# Patient Record
Sex: Male | Born: 1960 | Race: White | Hispanic: No | Marital: Married | Smoking: Former smoker
Health system: Southern US, Community
[De-identification: ages and names within clinical notes are randomized; demographics above are authoritative.]

## PROBLEM LIST (undated history)

## (undated) DIAGNOSIS — R7303 Prediabetes: Secondary | ICD-10-CM

## (undated) DIAGNOSIS — I2510 Atherosclerotic heart disease of native coronary artery without angina pectoris: Secondary | ICD-10-CM

## (undated) DIAGNOSIS — I4510 Unspecified right bundle-branch block: Secondary | ICD-10-CM

---
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
## 1979-05-13 HISTORY — PX: THORACENTESIS: SHX235

## 2007-04-02 ENCOUNTER — Ambulatory Visit: Payer: Self-pay | Admitting: Gastroenterology

## 2007-04-02 LAB — HM COLONOSCOPY

## 2008-02-13 DIAGNOSIS — J309 Allergic rhinitis, unspecified: Secondary | ICD-10-CM | POA: Insufficient documentation

## 2008-02-13 DIAGNOSIS — M545 Low back pain, unspecified: Secondary | ICD-10-CM | POA: Insufficient documentation

## 2008-07-12 DIAGNOSIS — I319 Disease of pericardium, unspecified: Secondary | ICD-10-CM

## 2008-07-12 HISTORY — DX: Disease of pericardium, unspecified: I31.9

## 2008-07-16 ENCOUNTER — Emergency Department: Payer: Self-pay | Admitting: Emergency Medicine

## 2008-08-12 DIAGNOSIS — G4730 Sleep apnea, unspecified: Secondary | ICD-10-CM | POA: Insufficient documentation

## 2008-08-12 DIAGNOSIS — I319 Disease of pericardium, unspecified: Secondary | ICD-10-CM | POA: Insufficient documentation

## 2008-08-12 DIAGNOSIS — I200 Unstable angina: Secondary | ICD-10-CM | POA: Insufficient documentation

## 2008-08-12 DIAGNOSIS — G4733 Obstructive sleep apnea (adult) (pediatric): Secondary | ICD-10-CM | POA: Insufficient documentation

## 2009-07-04 ENCOUNTER — Emergency Department: Payer: Self-pay | Admitting: Emergency Medicine

## 2009-08-16 ENCOUNTER — Ambulatory Visit: Payer: Self-pay | Admitting: General Practice

## 2009-09-27 ENCOUNTER — Ambulatory Visit: Payer: Self-pay | Admitting: General Practice

## 2009-10-25 ENCOUNTER — Ambulatory Visit: Payer: Self-pay | Admitting: Family Medicine

## 2009-10-27 ENCOUNTER — Emergency Department: Payer: Self-pay | Admitting: Emergency Medicine

## 2010-01-03 ENCOUNTER — Ambulatory Visit: Payer: Self-pay | Admitting: General Practice

## 2011-06-11 IMAGING — CR DG CHEST 2V
1 series · 3 of 3 positions shown · non-contrast
Comparison: none

REASON FOR EXAM: chest pain
COMMENTS:

[Series 1: view not recorded · 0.17mm/px · 3 of 3 slices shown]
[im 1/3]
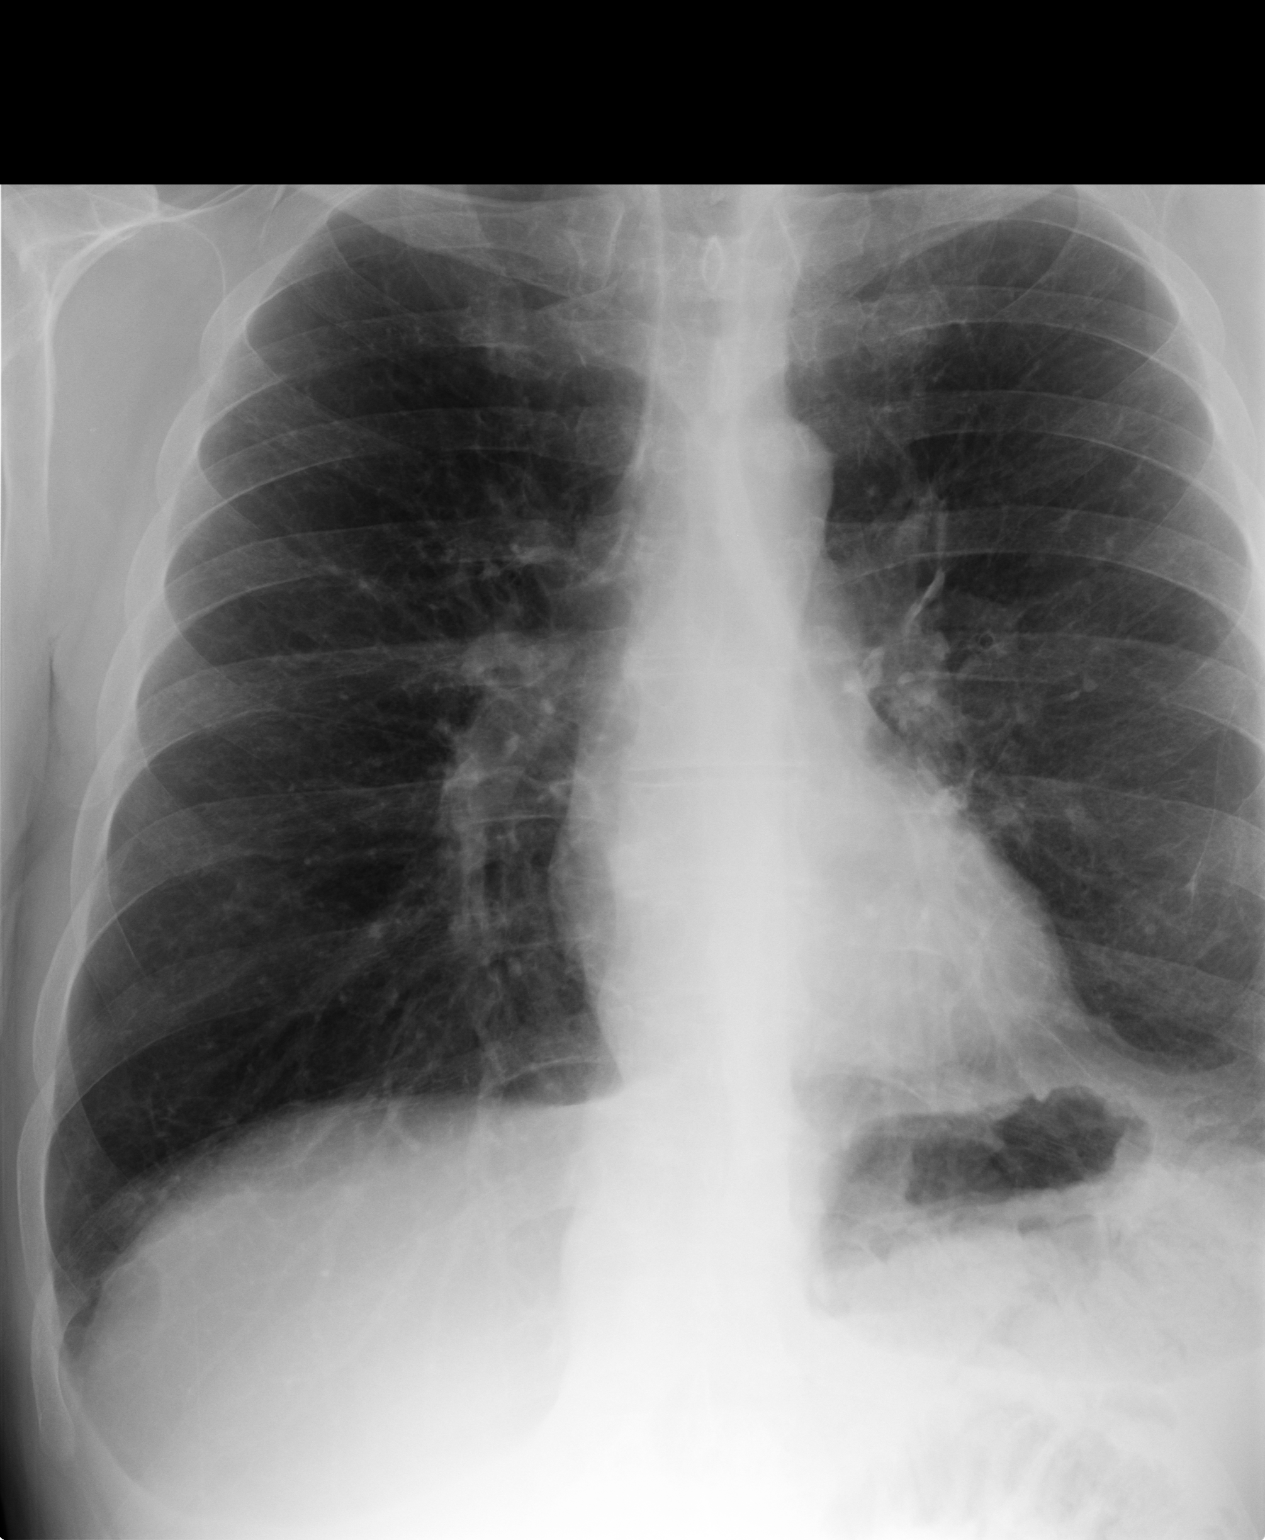
[im 2/3]
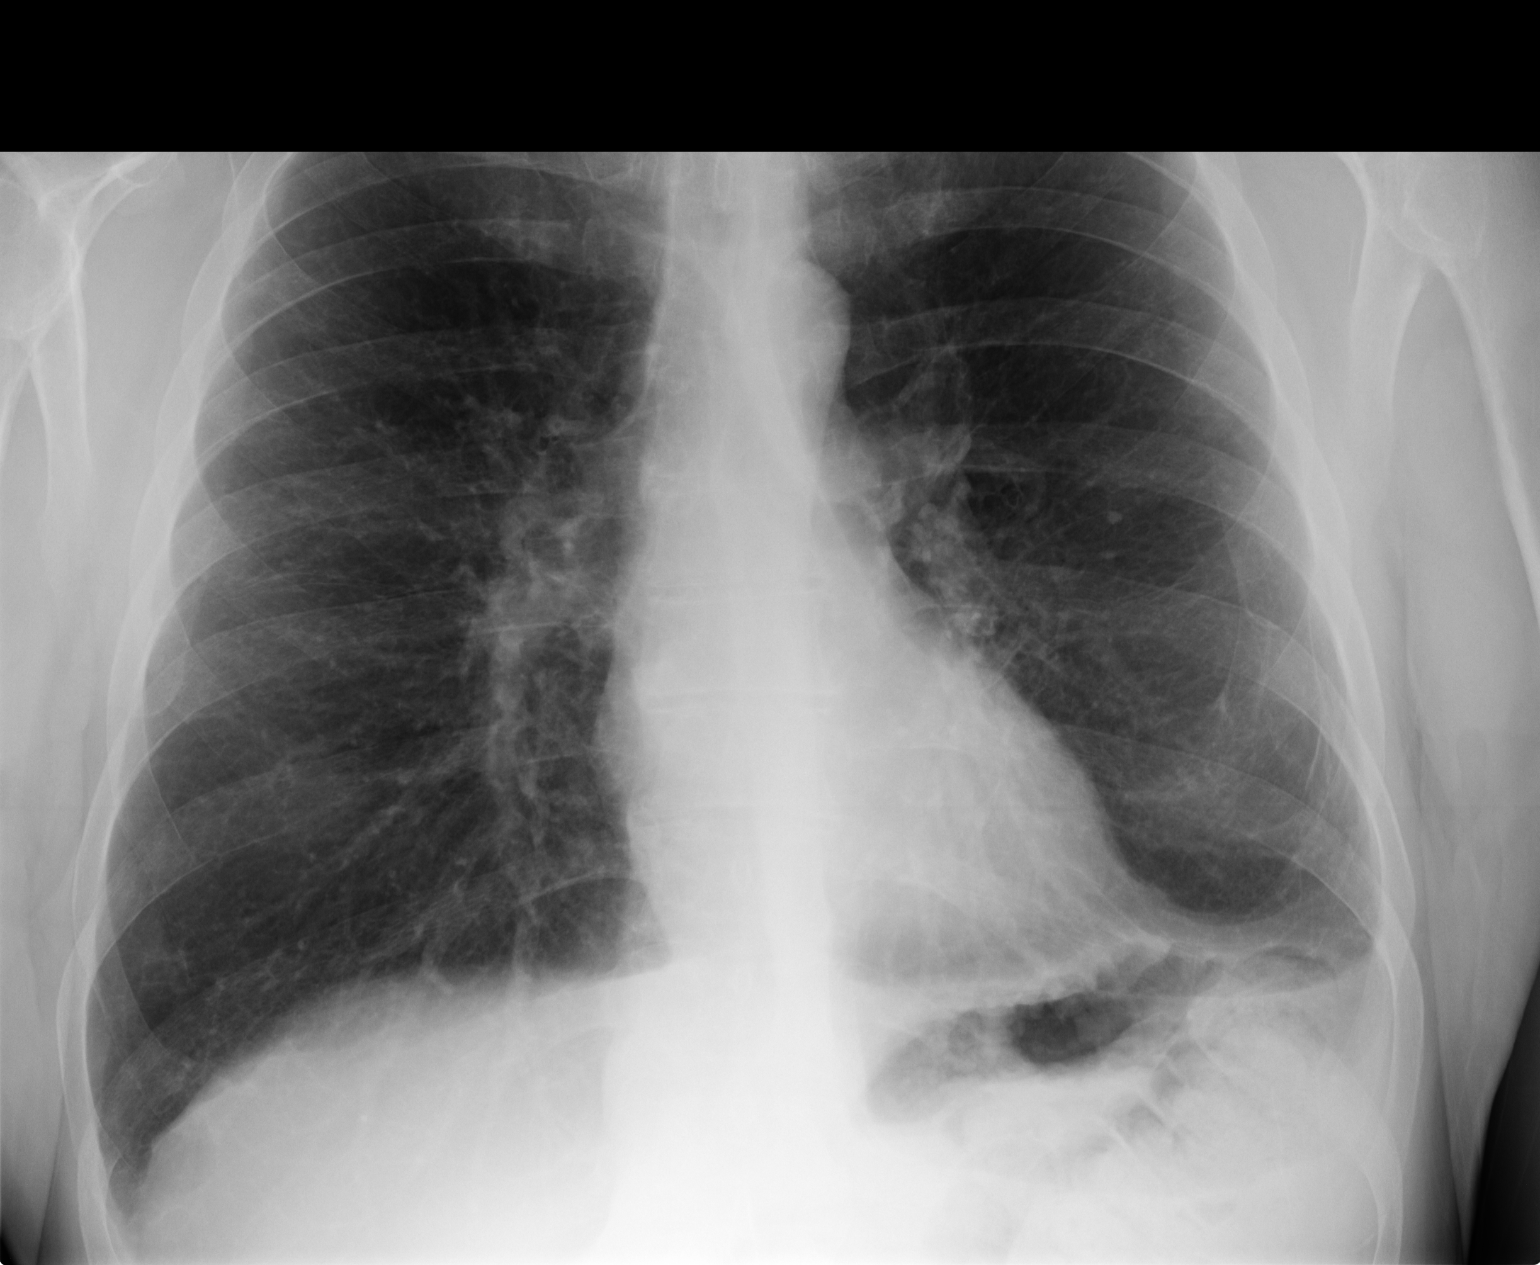
[im 3/3]
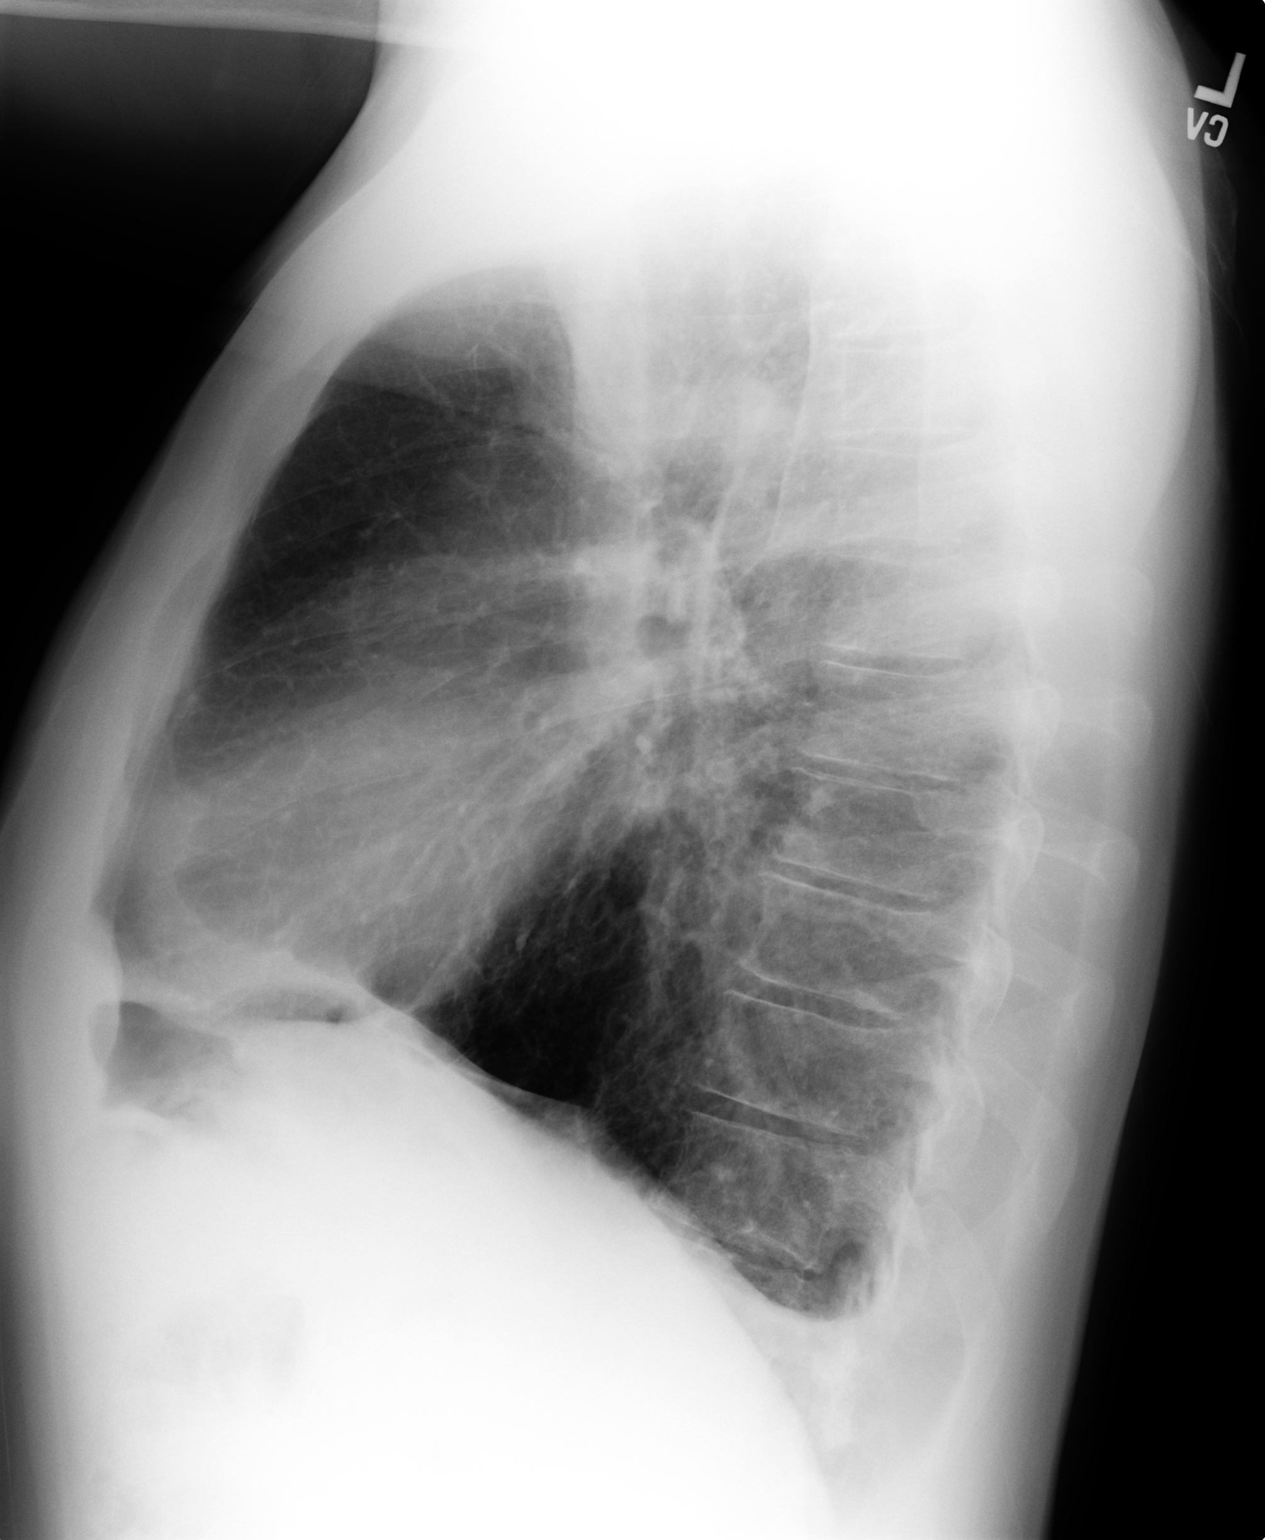

[3 of 3 positions shown; findings below may reference images not displayed]

PROCEDURE:     KDR - KDXR CHEST PA (OR AP) AND LAT  - October 25, 2009 [DATE]

RESULT:     There is increased density in the left base with blunting of the
left costophrenic angle. The findings are consistent with either fibrosis or
a small effusion. The lung fields otherwise are clear. The chest is
hyperexpanded compatible with COPD. Heart size is normal.
IMPRESSION: 1. There is increased density at the left base compatible with a small
effusion or with pleural fibrosis.
2. COPD.

## 2011-06-11 IMAGING — CR DG SHOULDER 3+V*L*
1 series · 3 of 3 positions shown · non-contrast
Comparison: none

REASON FOR EXAM: pain
COMMENTS:

PROCEDURE:     KDR - KDXR SHOULDER LEFT COMPLETE  - October 25, 2009 [DATE]
RESULT:     No fracture, dislocation or other acute bony abnormality is
seen. No soft tissue calcification about the humeral head is observed. No
fracture of the clavicle is seen.

[Series 2: view not recorded · 0.17mm/px · 3 of 3 slices shown]
[im 1/3]
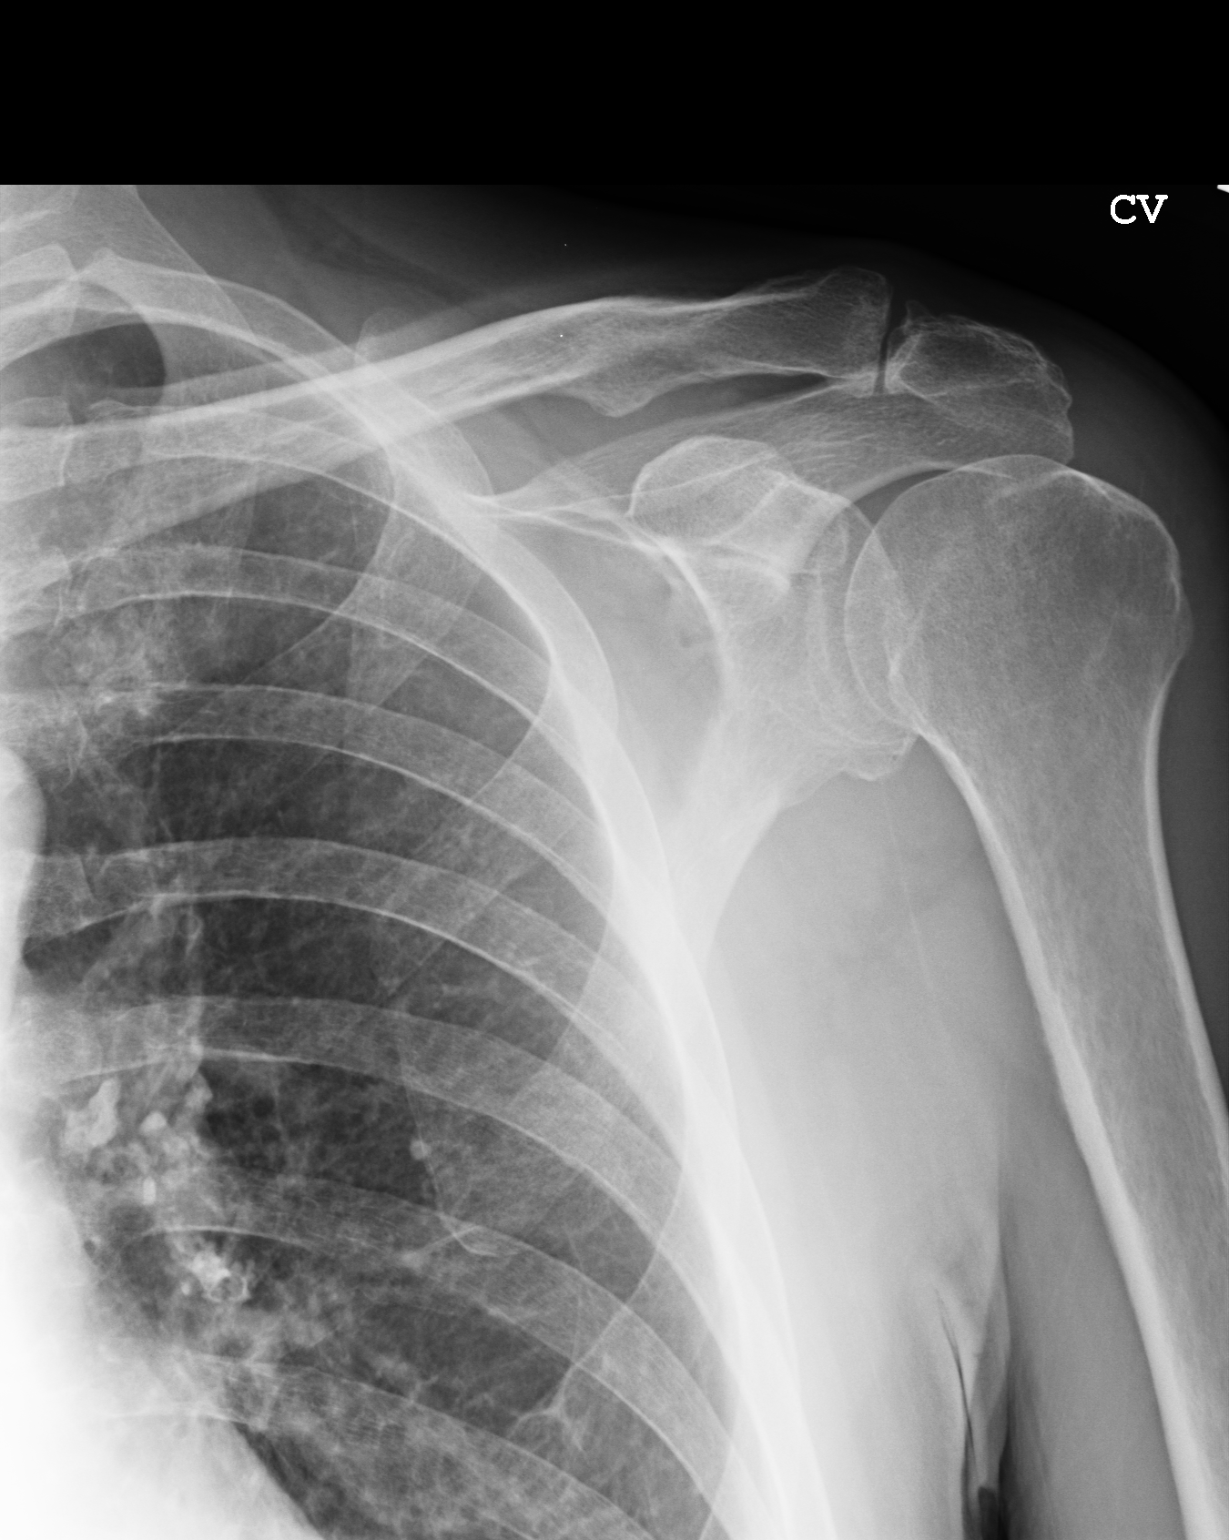
[im 2/3]
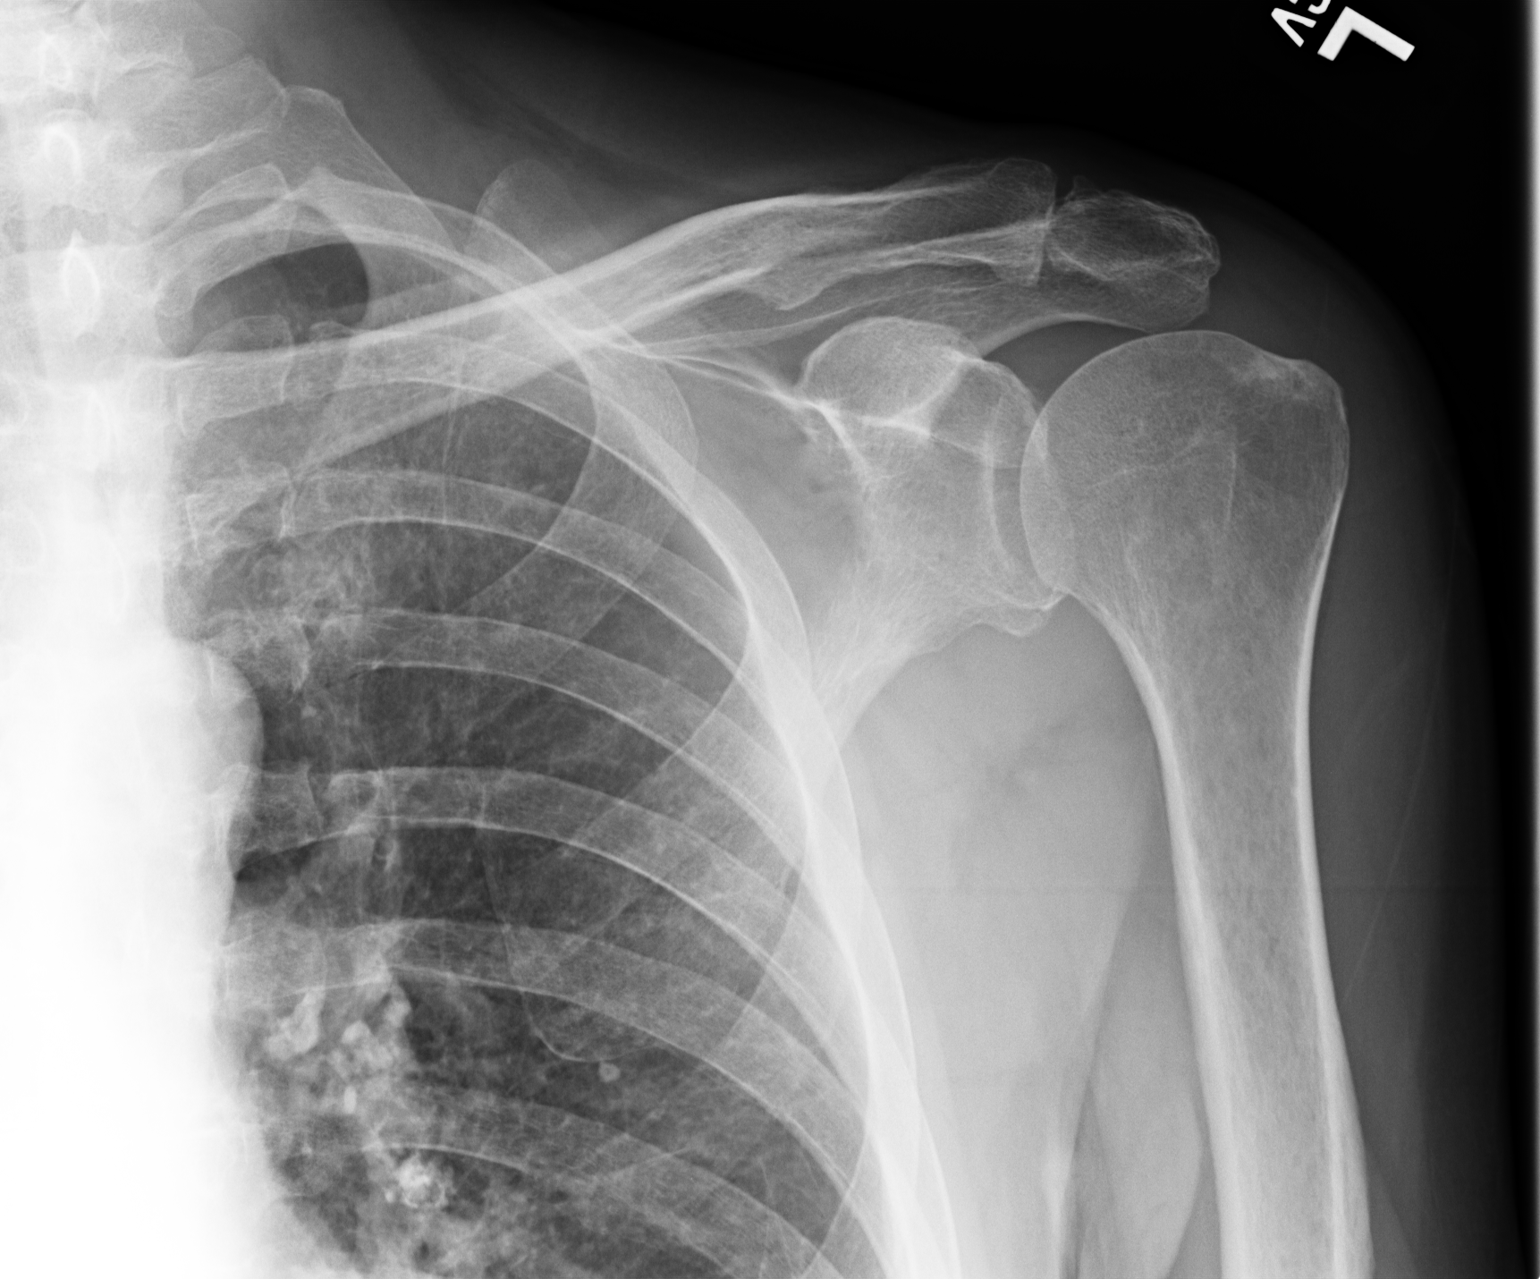
[im 3/3]
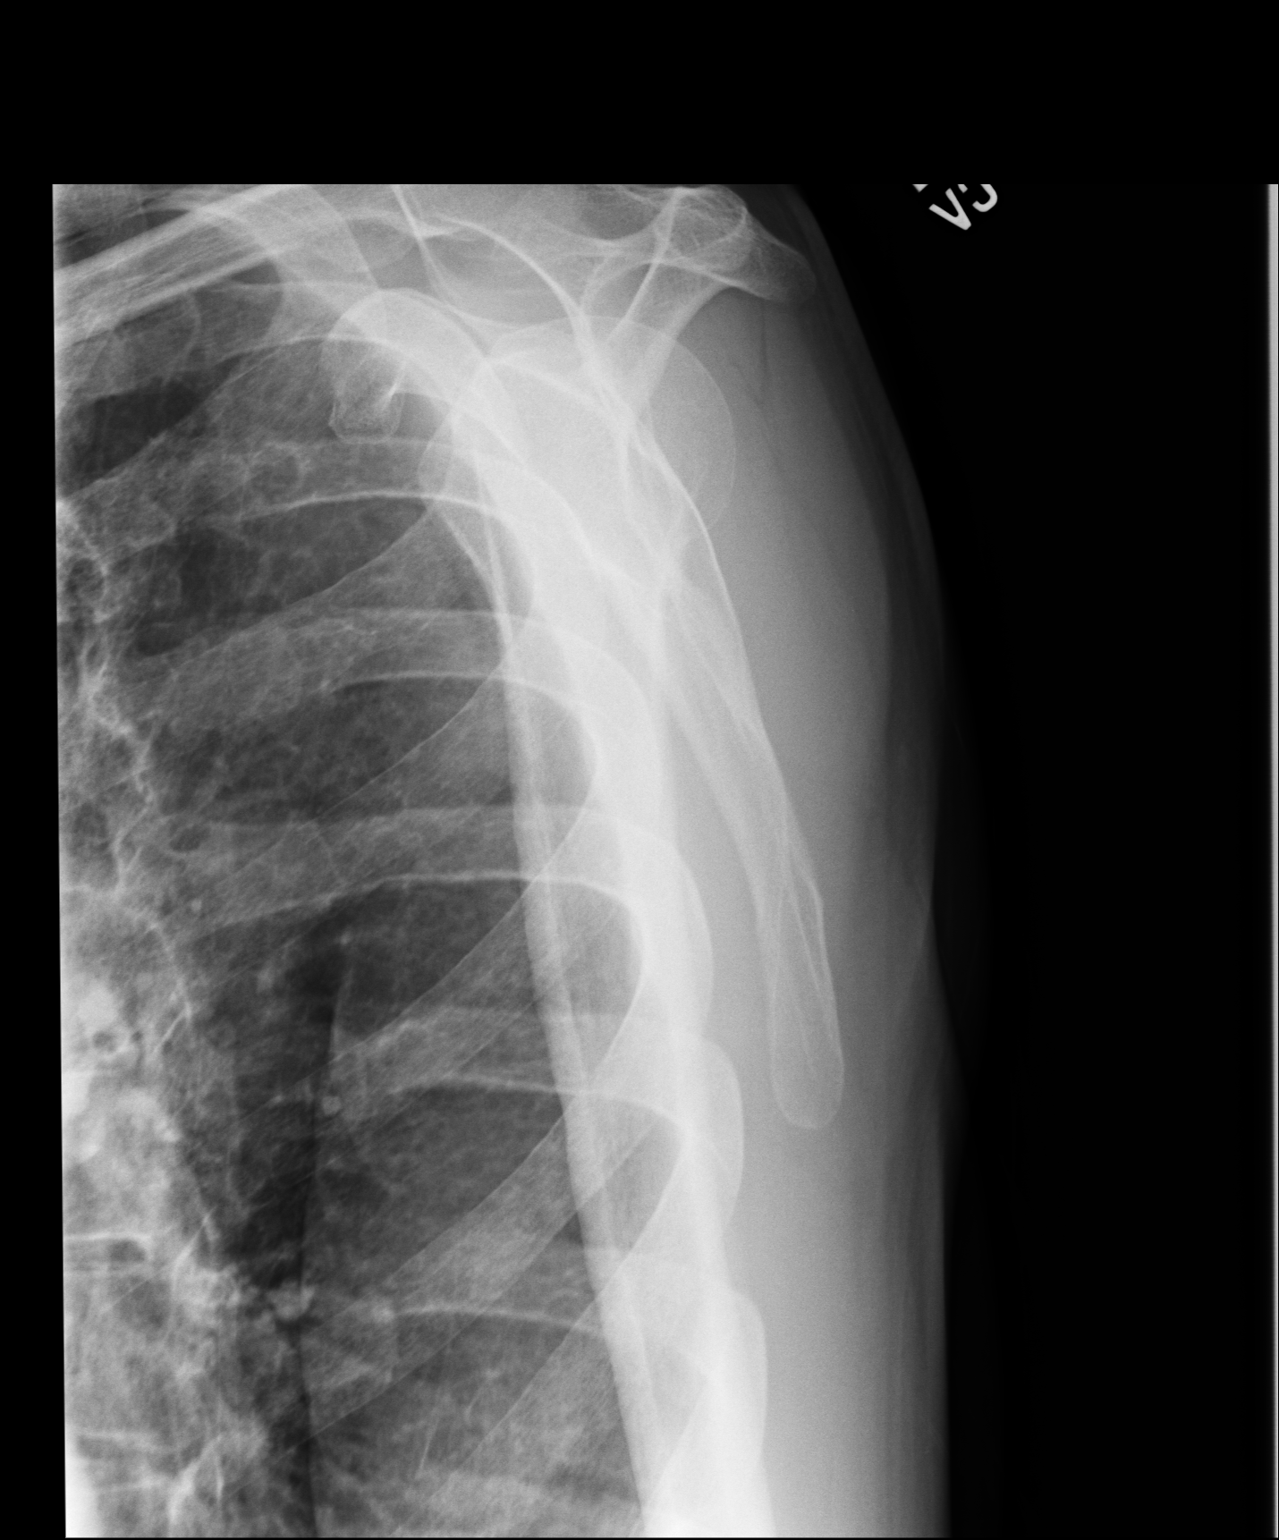

[3 of 3 positions shown; findings below may reference images not displayed]

IMPRESSION: 1.     No acute bony abnormalities are identified.

## 2012-03-25 ENCOUNTER — Ambulatory Visit: Payer: Self-pay | Admitting: Urology

## 2012-05-27 ENCOUNTER — Ambulatory Visit: Payer: Self-pay | Admitting: Surgery

## 2012-05-27 LAB — CBC WITH DIFFERENTIAL/PLATELET
Basophil #: 0 10*3/uL (ref 0.0–0.1)
Basophil %: 0.7 %
Eosinophil #: 0.1 10*3/uL (ref 0.0–0.7)
Eosinophil %: 1.5 %
HCT: 42.3 % (ref 40.0–52.0)
HGB: 14.8 g/dL (ref 13.0–18.0)
Lymphocyte #: 1.1 10*3/uL (ref 1.0–3.6)
Lymphocyte %: 24.5 %
MCH: 33.1 pg (ref 26.0–34.0)
MCHC: 34.9 g/dL (ref 32.0–36.0)
MCV: 95 fL (ref 80–100)
Monocyte #: 0.5 x10 3/mm (ref 0.2–1.0)
Monocyte %: 11.8 %
Neutrophil #: 2.8 10*3/uL (ref 1.4–6.5)
Neutrophil %: 61.5 %
Platelet: 159 10*3/uL (ref 150–440)
RBC: 4.46 10*6/uL (ref 4.40–5.90)
RDW: 13 % (ref 11.5–14.5)
WBC: 4.6 10*3/uL (ref 3.8–10.6)

## 2012-05-27 LAB — BASIC METABOLIC PANEL
Anion Gap: 7 (ref 7–16)
BUN: 14 mg/dL (ref 7–18)
Calcium, Total: 9.2 mg/dL (ref 8.5–10.1)
Chloride: 110 mmol/L — ABNORMAL HIGH (ref 98–107)
Co2: 25 mmol/L (ref 21–32)
Creatinine: 0.73 mg/dL (ref 0.60–1.30)
EGFR (African American): >60
EGFR (Non-African Amer.): >60
Glucose: 85 mg/dL (ref 65–99)
Osmolality: 283 (ref 275–301)
Potassium: 3.8 mmol/L (ref 3.5–5.1)
Sodium: 142 mmol/L (ref 136–145)

## 2012-06-03 ENCOUNTER — Ambulatory Visit: Payer: Self-pay | Admitting: Surgery

## 2013-11-09 IMAGING — US US PELVIS LIMITED
1 series · 14 of 25 positions shown · non-contrast
Comparison: none

REASON FOR EXAM: left scrotal mass
COMMENTS:

PROCEDURE:     NISHIMURA - NISHIMURA TESTICULAR  - March 25, 2012  [DATE]
RESULT:

[Series 1: us pelvis limited · 0.08mm/px · 14 of 76 slices shown]
[im 1/76]
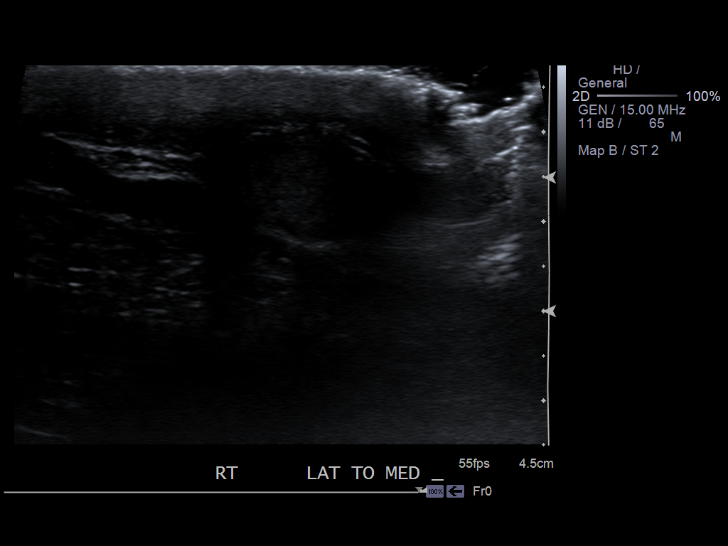
[im 7/76]
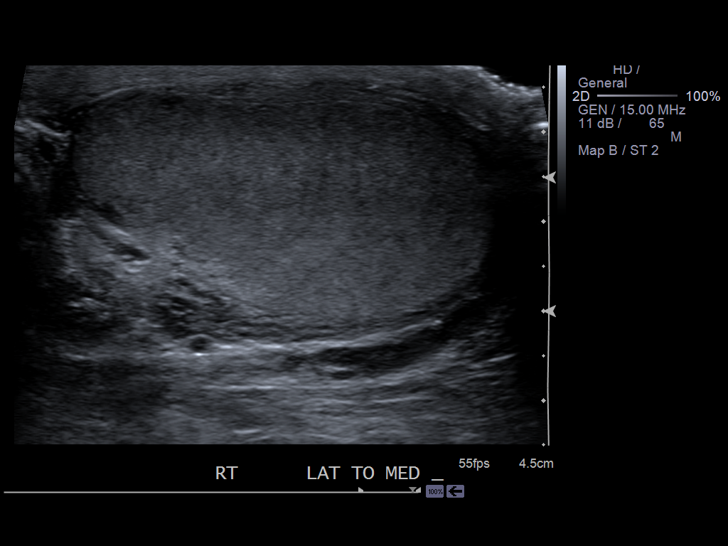
[im 13/76]
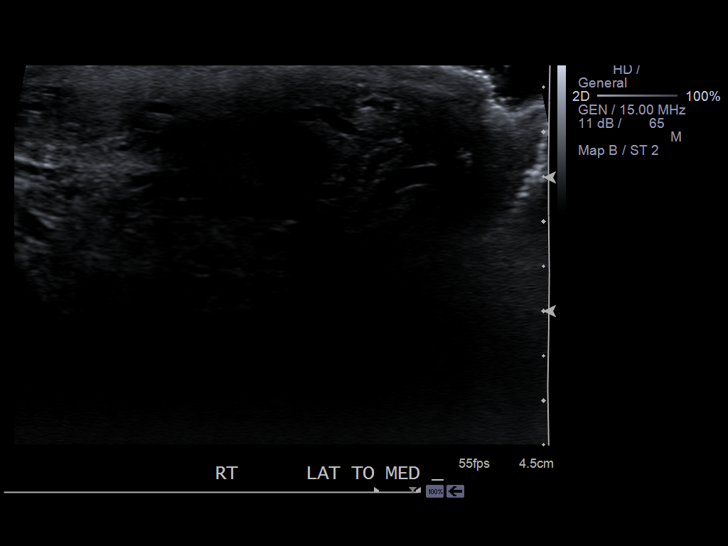
[im 19/76]
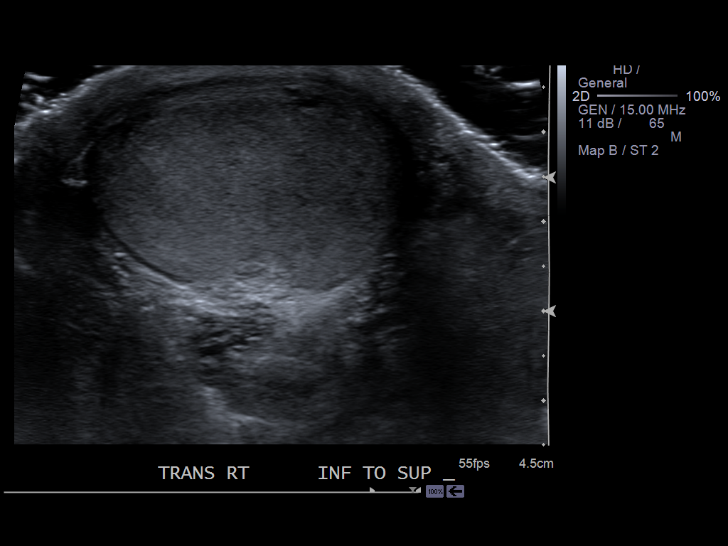
[im 26/76]
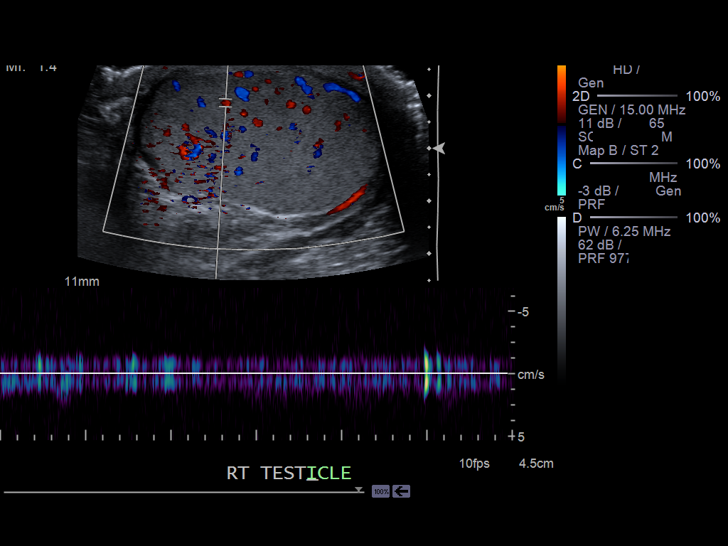
[im 29/76]
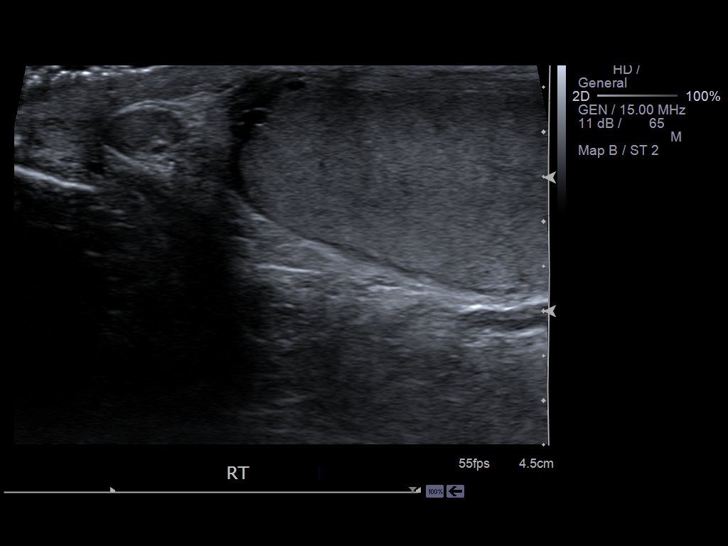
[im 35/76]
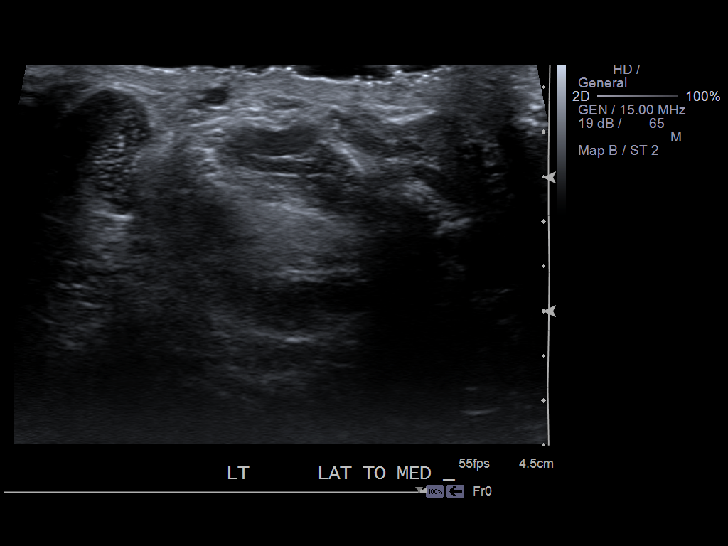
[im 41/76]
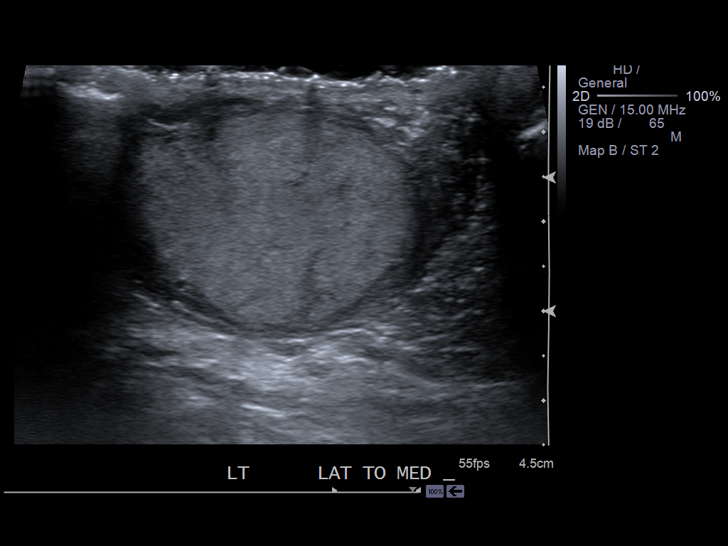
[im 47/76]
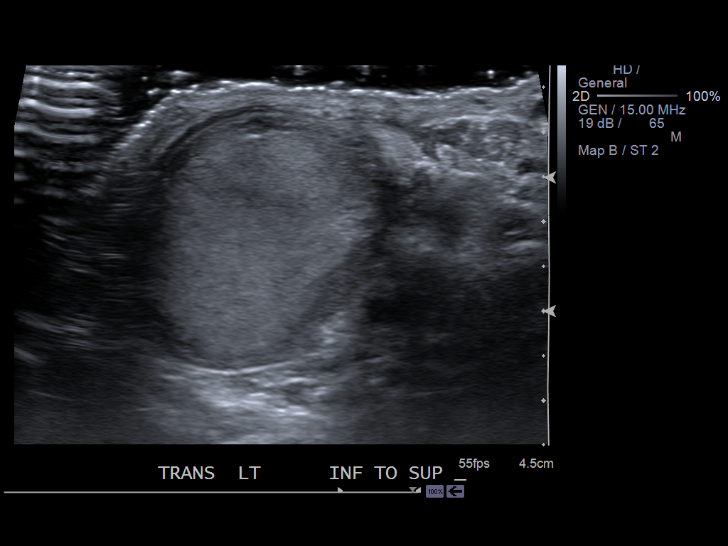
[im 51/76]
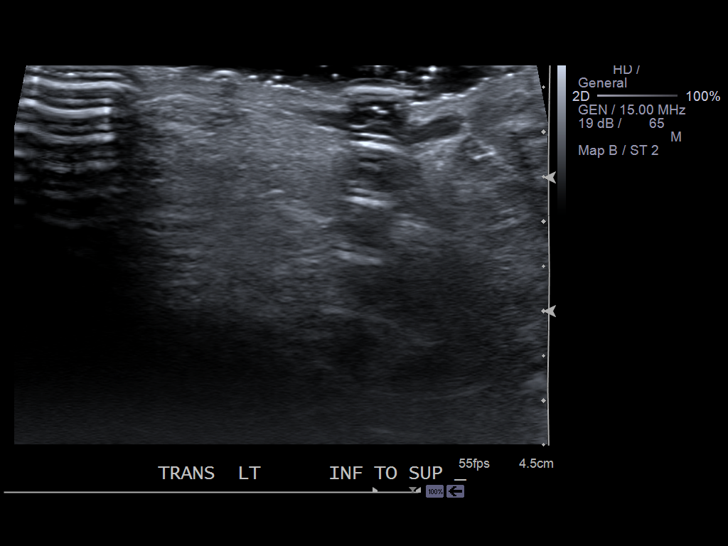
[im 57/76]
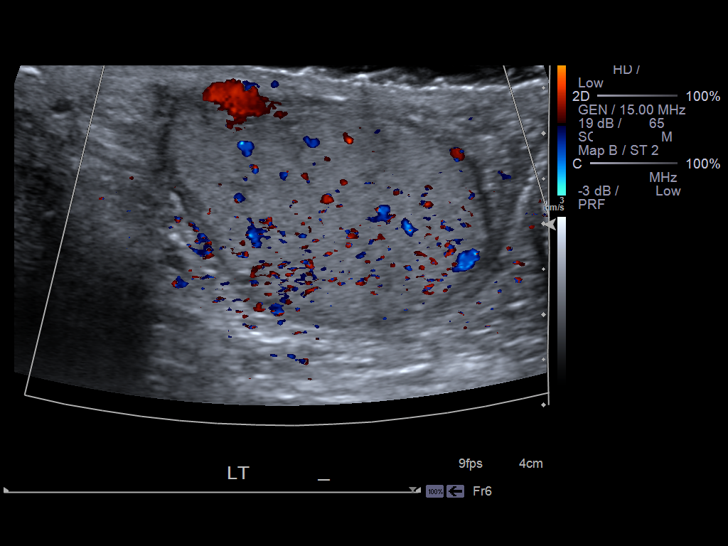
[im 63/76]
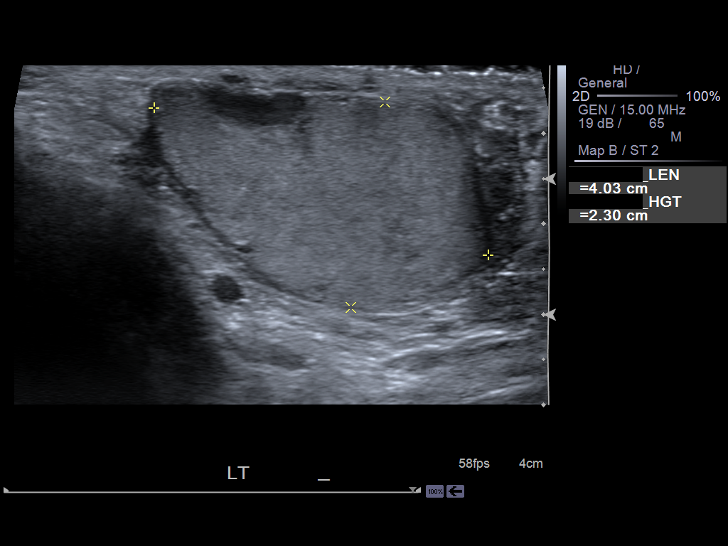
[im 69/76]
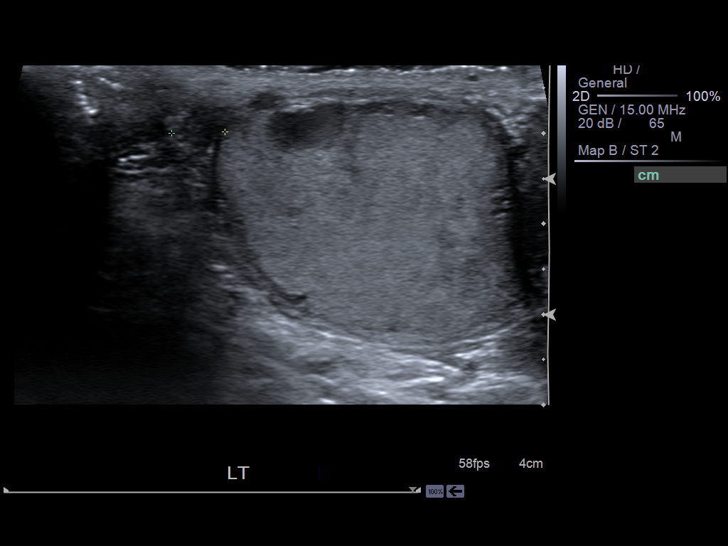
[im 76/76]
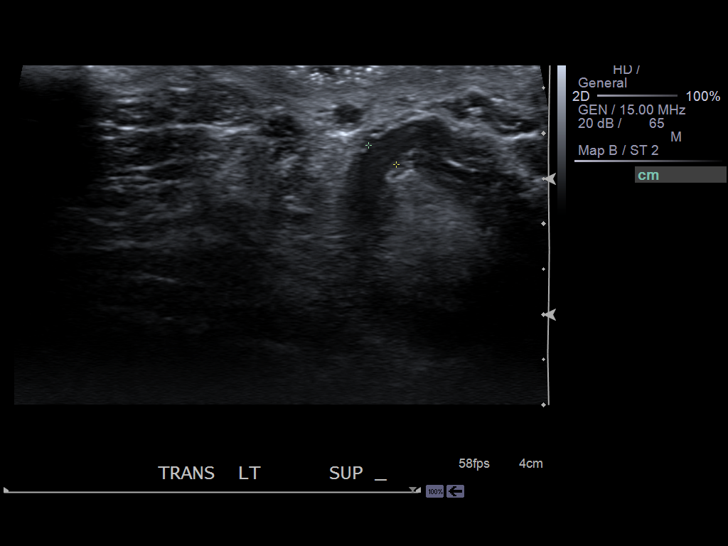

[14 of 25 positions shown; findings below may reference images not displayed]

FINDINGS: The right testicle measures 4.58 x 2.61 x 3.38 cm and the left
4.03 x 2.3 x 3.1 cm. The testicles demonstrate homogeneous echotexture.
Color filling of the testicular vessels is identified. Arterial and venous
waveforms are appreciated. The right epididymis measures 0.96 cm and the
left 0.59 cm. The epididymides demonstrate a homogeneous echotexture. A left
varicocele is appreciated. The varicocele appears to be adjacent to the left
testicle or possibly even traversing the superior aspect of the testicle.
There is no evidence of a varicocele on the right nor evidence of hydrocele
on the right or left.
IMPRESSION: 1. Left-sided varicocele which either rest adjacent to the left testicle or
possibly traverses the superior aspect of the testicle.
2. No further sonographic abnormality is identified.

## 2014-09-30 LAB — HEPATIC FUNCTION PANEL
ALT: 17 U/L (ref 10–40)
AST: 19 U/L (ref 14–40)

## 2014-09-30 LAB — BASIC METABOLIC PANEL
BUN: 12 mg/dL (ref 4–21)
Creatinine: 0.8 mg/dL (ref 0.6–1.3)
Glucose: 91 mg/dL
Potassium: 4.7 mmol/L (ref 3.4–5.3)
Sodium: 140 mmol/L (ref 137–147)

## 2014-09-30 LAB — LIPID PANEL
Cholesterol: 187 mg/dL (ref 0–200)
HDL: 36 mg/dL (ref 35–70)
LDL Cholesterol: 115 mg/dL
Triglycerides: 178 mg/dL — AB (ref 40–160)

## 2014-09-30 LAB — CBC AND DIFFERENTIAL
HCT: 45 % (ref 41–53)
Hemoglobin: 15.9 g/dL (ref 13.5–17.5)
Platelets: 268 10*3/uL (ref 150–399)
WBC: 5.9 10^3/mL

## 2014-09-30 LAB — TSH: TSH: 1.98 u[IU]/mL (ref 0.41–5.90)

## 2014-09-30 LAB — PSA: PSA: 0.8

## 2015-06-18 DIAGNOSIS — M952 Other acquired deformity of head: Secondary | ICD-10-CM | POA: Insufficient documentation

## 2015-06-18 DIAGNOSIS — Z6836 Body mass index (BMI) 36.0-36.9, adult: Secondary | ICD-10-CM | POA: Insufficient documentation

## 2015-06-18 DIAGNOSIS — N5089 Other specified disorders of the male genital organs: Secondary | ICD-10-CM | POA: Insufficient documentation

## 2015-06-18 DIAGNOSIS — Z789 Other specified health status: Secondary | ICD-10-CM | POA: Insufficient documentation

## 2015-06-18 DIAGNOSIS — E669 Obesity, unspecified: Secondary | ICD-10-CM | POA: Insufficient documentation

## 2015-06-21 ENCOUNTER — Ambulatory Visit (INDEPENDENT_AMBULATORY_CARE_PROVIDER_SITE_OTHER): Payer: Managed Care, Other (non HMO) | Admitting: Family Medicine

## 2015-06-21 ENCOUNTER — Encounter: Payer: Self-pay | Admitting: Family Medicine

## 2015-06-21 VITALS — BP 108/72 | HR 80 | Temp 98.2°F | Resp 20 | Ht 72.0 in | Wt 267.6 lb

## 2015-06-21 DIAGNOSIS — I2510 Atherosclerotic heart disease of native coronary artery without angina pectoris: Secondary | ICD-10-CM | POA: Diagnosis not present

## 2015-06-21 DIAGNOSIS — E668 Other obesity: Secondary | ICD-10-CM

## 2015-06-21 DIAGNOSIS — E669 Obesity, unspecified: Secondary | ICD-10-CM | POA: Diagnosis not present

## 2015-06-21 DIAGNOSIS — IMO0002 Reserved for concepts with insufficient information to code with codable children: Secondary | ICD-10-CM

## 2015-06-21 NOTE — Progress Notes (Signed)
Subjective:    Patient ID: Caleb Lopez, male    DOB: 1961-02-28, 55 y.o.   MRN: [id number]  HPI  Obesity: Patient complains of obesity. Patient cites health as reasons for wanting to lose weight.  Obesity History Weight in late teens: 185lb lb. Period of greatest weight gain: 100+ lb during mid adult years Lowest adult weight: 180lb Highest adult weight: 320lb Amount of time at present weight: 5 months.   History of Weight Loss Efforts Greatest amount of weight lost: 140 lb over 6 months Amount of time that loss was maintained: 4 years months Circumstances associated with regain of weight: not being able to exercise due to fasciitis. Successful weight loss techniques attempted: self-directed dieting . Is  A vegetarian.   Unsuccessful weight loss techniques attempted: pt reports it is easy for him to lose weight when he can exercise.  Current Exercise Habits none secondary to fasciitis and other foot pain  Current Eating Habits Number of regular meals per day: 2 Number of snacking episodes per day: 0 Who shops for food? wife Who prepares food? patient Who eats with patient? patient Binge behavior?: yes - can eat up to 1 whole large pizza (once every 2 months or so) Purge behavior? no  Anorexic behavior? no Eating precipitated by stress? no Guilt feelings associated with eating? no  Does eat a lot of beans.   Does not eat red meat.   Does eat a lot of burrito shells and crackers.  Does eat a lot of beans and  Nuts.   Has a plan.   Is going to stop eating burrito shells and stick with his beans.  Is planning to eat some eggs. Does not fry up eggs because eats to many.    Review of Systems  Constitutional: Negative for fever, chills, diaphoresis, activity change, appetite change, fatigue and unexpected weight change.   BP 108/72 mmHg  Pulse 80  Temp(Src) 98.2 F (36.8 C) (Oral)  Resp 20  Ht 6' (1.829 m)  Wt 267 lb 9.6 oz (121.383 kg)  BMI 36.29 kg/m2   Patient  Active Problem List   Diagnosis Date Noted  . Adult BMI 30+ 06/18/2015  . Adiposity 06/18/2015  . Lump in scrotum 06/18/2015  . Acquired deformity of head 06/18/2015  . Strict vegetarian diet 06/18/2015  . 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 Pericarditis 08/12/2008  . Apnea, sleep 08/12/2008  . Allergic rhinitis 02/13/2008  . LBP (low back pain) 02/13/2008   No past medical history on file. No current outpatient prescriptions on file prior to visit.   No current facility-administered medications on file prior to visit.   No Known Allergies Past Surgical History  Procedure Laterality Date  . Tonsillectomy and adenoidectomy  1960'S  . Thoracentesis  1980'S    x's 2 probably infection   Social History   Social History  . Marital Status: Married    Spouse Name: N/A  . Number of Children: N/A  . Years of Education: N/A   Occupational History  . Not on file.   Social History Main Topics  . Smoking status: Former Smoker    Quit date: 09/11/1988  . Smokeless tobacco: Never Used  . Alcohol Use: Yes     Comment: occasional  . Drug Use: No  . Sexual Activity: Not on file   Other Topics Concern  . Not on file   Social History Narrative   Family History  Problem Relation Age of Onset  . Diverticulitis  Mother   . Arthritis Mother   . Glaucoma Mother   . Diabetes Father       Objective:   Physical Exam  Constitutional: He is oriented to person, place, and time. He appears well-developed and well-nourished.  Neurological: He is alert and oriented to person, place, and time.  Psychiatric: He has a normal mood and affect. His behavior is normal. Judgment and thought content normal.   BP 108/72 mmHg  Pulse 80  Temp(Src) 98.2 F (36.8 C) (Oral)  Resp 20  Ht 6' (1.829 m)  Wt 267 lb 9.6 oz (121.383 kg)  BMI 36.29 kg/m2     Assessment & Plan:  1. Adult BMI 30+ Improved. Has gained some weight back because he is not exercising.  Will restart exercise.     2. Obesity Recurrent.  Is still down 50 pounds. Will add back some eggs. Continue to decrease carbohydrates and monitor diet. Recommend look into low carb foods.    3. Coronary artery disease involving native coronary artery of native heart without angina pectoris  Continue healthy lifestyle and exercise. Add some protein and filled out form for work.  Over 15 minutes in direct counseling regarding diet and exercise.      Margarita Rana, MD

## 2016-01-17 ENCOUNTER — Encounter: Payer: Self-pay | Admitting: Podiatry

## 2016-01-17 ENCOUNTER — Ambulatory Visit (INDEPENDENT_AMBULATORY_CARE_PROVIDER_SITE_OTHER): Payer: Managed Care, Other (non HMO) | Admitting: Podiatry

## 2016-01-17 ENCOUNTER — Ambulatory Visit (INDEPENDENT_AMBULATORY_CARE_PROVIDER_SITE_OTHER): Payer: Managed Care, Other (non HMO)

## 2016-01-17 VITALS — BP 131/85 | HR 74 | Resp 16

## 2016-01-17 DIAGNOSIS — M898X9 Other specified disorders of bone, unspecified site: Secondary | ICD-10-CM | POA: Diagnosis not present

## 2016-01-17 DIAGNOSIS — M2041 Other hammer toe(s) (acquired), right foot: Secondary | ICD-10-CM

## 2016-01-17 DIAGNOSIS — Q828 Other specified congenital malformations of skin: Secondary | ICD-10-CM

## 2016-01-17 NOTE — Progress Notes (Signed)
   Subjective:    Patient ID: Caleb Lopez, male    DOB: [birth year], 55 y.o.   MRN: [id number]  HPI: He presents today with a chief complaint of warts between fourth and fifth digits of the right foot. He states that he's been doing his dermatologist several times and has been frozen off with liquid nitrogen. He states they continue to come back. He states that they're painful and very aggravating at times.    Review of Systems  All other systems reviewed and are negative.      Objective:   Physical Exam: Vital signs are stable alert and oriented 3. Pulses are strongly palpable. Neurologic sensorium is intact. Degenerative flexors are intact. Muscle strength is normal. Deep tendon reflexes are intact bilateral muscle strength is normal bilateral. 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 Radiographs do demonstrate neck size ptosis distal phalanx fifth digit right foot. Reactive hyperkeratosis is also noted to the lateral aspect of the PIPJ fifth digit right foot.        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 Discussed possible need for surgery in the future. He understands this is amenable to it.

## 2016-06-19 ENCOUNTER — Encounter: Payer: Self-pay | Admitting: Family Medicine

## 2016-06-19 ENCOUNTER — Ambulatory Visit (INDEPENDENT_AMBULATORY_CARE_PROVIDER_SITE_OTHER): Payer: Managed Care, Other (non HMO) | Admitting: Family Medicine

## 2016-06-19 VITALS — BP 108/76 | HR 63 | Temp 97.7°F | Resp 14 | Ht 71.5 in | Wt 264.4 lb

## 2016-06-19 DIAGNOSIS — Z1159 Encounter for screening for other viral diseases: Secondary | ICD-10-CM | POA: Diagnosis not present

## 2016-06-19 DIAGNOSIS — Z6836 Body mass index (BMI) 36.0-36.9, adult: Secondary | ICD-10-CM | POA: Diagnosis not present

## 2016-06-19 DIAGNOSIS — Z125 Encounter for screening for malignant neoplasm of prostate: Secondary | ICD-10-CM | POA: Diagnosis not present

## 2016-06-19 DIAGNOSIS — Z114 Encounter for screening for human immunodeficiency virus [HIV]: Secondary | ICD-10-CM

## 2016-06-19 DIAGNOSIS — Z0000 Encounter for general adult medical examination without abnormal findings: Secondary | ICD-10-CM | POA: Diagnosis not present

## 2016-06-19 DIAGNOSIS — Z1211 Encounter for screening for malignant neoplasm of colon: Secondary | ICD-10-CM

## 2016-06-19 DIAGNOSIS — I2510 Atherosclerotic heart disease of native coronary artery without angina pectoris: Secondary | ICD-10-CM

## 2016-06-19 LAB — POCT URINALYSIS DIPSTICK
Bilirubin, UA: NEGATIVE
Blood, UA: NEGATIVE
Glucose, UA: NEGATIVE
Ketones, UA: NEGATIVE
Leukocytes, UA: NEGATIVE
Nitrite, UA: NEGATIVE
Protein, UA: NEGATIVE
Spec Grav, UA: 1.025
Urobilinogen, UA: 0.2
pH, UA: 5

## 2016-06-19 LAB — IFOBT (OCCULT BLOOD): IFOBT: NEGATIVE

## 2016-06-19 LAB — FECAL OCCULT BLOOD, GUAIAC: Fecal Occult Blood: NEGATIVE

## 2016-06-19 NOTE — Progress Notes (Signed)
Patient: Caleb Lopez, Male    DOB: Oct 31, 1960, 55 y.o.   MRN: [id number] Visit Date: 06/19/2016  Today's Provider: Vernie Murders, PA   Chief Complaint  Patient presents with  . Annual Exam   Subjective:    Annual physical exam Caleb Lopez is a 55 y.o. male who presents today for health maintenance and complete physical. He feels well. He reports exercising 2 times per week. He reports he is sleeping average 6-7 hours per night.  -----------------------------------------------------------------   Review of Systems  Constitutional: Negative.   HENT: Negative.   Eyes: Negative.   Respiratory: Negative.   Cardiovascular: Negative.   Gastrointestinal: Negative.   Endocrine: Negative.   Genitourinary: Negative.   Musculoskeletal: Negative.   Skin: Negative.   Allergic/Immunologic: Negative.   Neurological: Negative.   Hematological: Negative.   Psychiatric/Behavioral: Negative.     Social History      He  reports that he quit smoking about 27 years ago. He has never used smokeless tobacco. He reports that he drinks alcohol. He reports that he does not use drugs.       Social History   Social History  . Marital status: Married    Spouse name: N/A  . Number of children: N/A  . Years of education: N/A   Social History Main Topics  . Smoking status: Former Smoker    Quit date: 09/11/1988  . Smokeless tobacco: Never Used  . Alcohol use Yes     Comment: occasional  . Drug use: No  . Sexual activity: Not Asked   Other Topics Concern  . None   Social History Narrative  . None    Patient Active Problem List   Diagnosis Date Noted  . Obesity 06/21/2015  . Coronary artery disease 06/21/2015  . Adult BMI 30+ 06/18/2015  . Adiposity 06/18/2015  . Lump in scrotum 06/18/2015  . Acquired deformity of head 06/18/2015  . Strict vegetarian diet 06/18/2015  . Pericarditis 08/12/2008  . Apnea, sleep 08/12/2008  . Allergic rhinitis 02/13/2008  . LBP (low  back pain) 02/13/2008    Past Surgical History:  Procedure Laterality Date  . THORACENTESIS  1980'S   x's 2 probably infection  . TONSILLECTOMY AND ADENOIDECTOMY  1960'S    Family History        Family Status  Relation Status  . Mother Alive  . Father Alive  . Sister Alive   pituitary adenoma        His family history includes Arthritis in his mother; Diabetes in his father; Diverticulitis in his mother; Glaucoma in his mother.    No Known Allergies  No outpatient prescriptions have been marked as taking for the 06/19/16 encounter (Office Visit) with Margo Common, PA.    Patient Care Team: Margo Common, PA as PCP - General (Family Medicine)     Objective:   Vitals: BP 108/76 (BP Location: Right Arm, Patient Position: Sitting, Cuff Size: Large)   Pulse 63   Temp 97.7 F (36.5 C) (Oral)   Resp 14   Ht 5' 11.5" (1.816 m)   Wt 264 lb 6.4 oz (119.9 kg)   BMI 36.36 kg/m   Wt Readings from Last 3 Encounters:  06/19/16 264 lb 6.4 oz (119.9 kg)  06/21/15 267 lb 9.6 oz (121.4 kg)  09/30/14 265 lb (120.2 kg)    Physical Exam  Constitutional: He is oriented to person, place, and time. He appears well-developed and well-nourished.  HENT:  Head: Normocephalic and atraumatic.  Right Ear: External ear normal.  Left Ear: External ear normal.  Nose: Nose normal.  Mouth/Throat: Oropharynx is clear and moist.  Eyes: Conjunctivae and EOM are normal. Pupils are equal, round, and reactive to light. Right eye exhibits no discharge.  Neck: Normal range of motion. Neck supple. No tracheal deviation present. No thyromegaly present.  Cardiovascular: Normal rate, regular rhythm, normal heart sounds and intact distal pulses.   No murmur heard. Pulmonary/Chest: Effort normal and breath sounds normal. No respiratory distress. He has no wheezes. He has no rales. He exhibits no tenderness.  Abdominal: Soft. Bowel sounds are normal. He exhibits no distension and no mass. There is no  tenderness. There is no rebound and no guarding.  Genitourinary: Prostate normal and penis normal.  Musculoskeletal: Normal range of motion. He exhibits no edema or tenderness.  Lymphadenopathy:    He has no cervical adenopathy.  Neurological: He is alert and oriented to person, place, and time. He has normal reflexes. No cranial nerve deficit. He exhibits normal muscle tone. Coordination normal.  Skin: Skin is warm and dry. No rash noted. No erythema.  Psychiatric: He has a normal mood and affect. His behavior is normal. Judgment and thought content normal.   Depression Screen PHQ 2/9 Scores 06/19/2016  PHQ - 2 Score 0    Assessment & Plan:     Routine Health Maintenance and Physical Exam  Exercise Activities and Dietary recommendations Goals    Walking a mile daily and following a vegetarian-type diet.      Immunization History  Administered Date(s) Administered  . Tdap 04/29/2012  . Zoster 04/29/2012    Health Maintenance  Topic Date Due  . Hepatitis C Screening  09-29-1960  . HIV Screening  03/02/1974  . INFLUENZA VACCINE  04/11/2016  . COLONOSCOPY  04/01/2017  . TETANUS/TDAP  04/29/2022      Discussed health benefits of physical activity, and encouraged him to engage in regular exercise appropriate for his age and condition.    -------------------------------------------------------------------- 1. Annual physical exam General health stable. Immunizations up to date. EKG showed RBBB but denies chest pain or palpitations. Continue regular exercise program and get routine labs. - EKG 12-Lead - POCT Urinalysis Dipstick  2. Adult BMI 36.0-36.9 kg/sq m Improved. Lost weight down to 264 lbs from 300 lbs in the past 3 years. With this much weight loss, sleep apnea has cleared and no longer using the CPAP. Will check routine labs to evaluate metabolic function. - CBC with Differential/Platelet - Comprehensive metabolic panel - TSH  3. Coronary artery disease  involving native coronary artery of native heart without angina pectoris Cardiac cath in 2009 showed 60% stenosis of LAD per Dr. Mauri Pole (cardiologist at [facility]). Will recheck labs. No chest pains or palpitations. EKG shows RBBB. Diagnosed with pericarditis on 08-12-08. Follow up pending lab reports.. - CBC with Differential/Platelet - Comprehensive metabolic panel - Lipid panel - TSH  4. Prostate cancer screening Normal DRE. No polyuria, nocturia or hematuria. Check PSA today. - PSA  5. Screening for HIV (human immunodeficiency virus) - HIV antibody  6. Need for hepatitis C screening test - Hepatitis C antibody  7. Colon cancer screening Last colonoscopy was on 04-02-07 by Dr. Allen Norris showing sigmoid diverticulosis with internal hemorrhoids. Due for repeat colonoscopy in 2018. - IFOBT POC (occult bld, rslt in office)    Vernie Murders, [facility]

## 2016-06-20 LAB — PSA: Prostate Specific Ag, Serum: 1.2 ng/mL (ref 0.0–4.0)

## 2016-06-20 LAB — CBC WITH DIFFERENTIAL/PLATELET
Basophils Absolute: 0 10*3/uL (ref 0.0–0.2)
Basos: 0 %
EOS (ABSOLUTE): 0.1 10*3/uL (ref 0.0–0.4)
Eos: 1 %
Hematocrit: 43.4 % (ref 37.5–51.0)
Hemoglobin: 15.5 g/dL (ref 12.6–17.7)
Immature Grans (Abs): 0 10*3/uL (ref 0.0–0.1)
Immature Granulocytes: 0 %
Lymphocytes Absolute: 1.3 10*3/uL (ref 0.7–3.1)
Lymphs: 26 %
MCH: 31.3 pg (ref 26.6–33.0)
MCHC: 35.7 g/dL (ref 31.5–35.7)
MCV: 88 fL (ref 79–97)
Monocytes Absolute: 0.7 10*3/uL (ref 0.1–0.9)
Monocytes: 13 %
Neutrophils Absolute: 2.9 10*3/uL (ref 1.4–7.0)
Neutrophils: 60 %
Platelets: 224 10*3/uL (ref 150–379)
RBC: 4.95 x10E6/uL (ref 4.14–5.80)
RDW: 14 % (ref 12.3–15.4)
WBC: 4.9 10*3/uL (ref 3.4–10.8)

## 2016-06-20 LAB — COMPREHENSIVE METABOLIC PANEL
ALT: 21 IU/L (ref 0–44)
AST: 18 IU/L (ref 0–40)
Albumin/Globulin Ratio: 1.7 (ref 1.2–2.2)
Albumin: 4.4 g/dL (ref 3.5–5.5)
Alkaline Phosphatase: 85 IU/L (ref 39–117)
BUN/Creatinine Ratio: 17 (ref 9–20)
BUN: 15 mg/dL (ref 6–24)
Bilirubin Total: 0.3 mg/dL (ref 0.0–1.2)
CO2: 23 mmol/L (ref 18–29)
Calcium: 9.4 mg/dL (ref 8.7–10.2)
Chloride: 105 mmol/L (ref 96–106)
Creatinine, Ser: 0.87 mg/dL (ref 0.76–1.27)
GFR calc Af Amer: 111 mL/min/{1.73_m2} (ref >59)
GFR calc non Af Amer: 96 mL/min/{1.73_m2} (ref >59)
Globulin, Total: 2.6 g/dL (ref 1.5–4.5)
Glucose: 96 mg/dL (ref 65–99)
Potassium: 4.7 mmol/L (ref 3.5–5.2)
Sodium: 141 mmol/L (ref 134–144)
Total Protein: 7 g/dL (ref 6.0–8.5)

## 2016-06-20 LAB — LIPID PANEL
Chol/HDL Ratio: 5.3 ratio units — ABNORMAL HIGH (ref 0.0–5.0)
Cholesterol, Total: 160 mg/dL (ref 100–199)
HDL: 30 mg/dL — ABNORMAL LOW (ref >39)
LDL Calculated: 105 mg/dL — ABNORMAL HIGH (ref 0–99)
Triglycerides: 126 mg/dL (ref 0–149)
VLDL Cholesterol Cal: 25 mg/dL (ref 5–40)

## 2016-06-20 LAB — HEPATITIS C ANTIBODY: Hep C Virus Ab: <0.1 s/co ratio (ref 0.0–0.9)

## 2016-06-20 LAB — HIV ANTIBODY (ROUTINE TESTING W REFLEX): HIV Screen 4th Generation wRfx: NONREACTIVE

## 2016-06-20 LAB — TSH: TSH: 2.5 u[IU]/mL (ref 0.450–4.500)

## 2016-06-21 ENCOUNTER — Telehealth: Payer: Self-pay

## 2016-06-21 NOTE — Telephone Encounter (Signed)
Patient advised. Lab results printed at front desk for pick up per patient's request.  

## 2016-06-21 NOTE — Telephone Encounter (Signed)
-----   Message from Margo Common, [place] sent at 06/20/2016  6:25 PM EDT ----- All blood tests are normal except HDL cholesterol is low and LDL cholesterol is a little elevated. Recommend regular exercise, low fat diet and Krill Oil one daily. Recheck cholesterol in 6 months.

## 2016-06-21 NOTE — Telephone Encounter (Signed)
No answer and voicemail is not set up. Will try again later.

## 2017-12-27 ENCOUNTER — Encounter: Payer: Self-pay | Admitting: Family Medicine

## 2017-12-27 ENCOUNTER — Ambulatory Visit (INDEPENDENT_AMBULATORY_CARE_PROVIDER_SITE_OTHER): Payer: Managed Care, Other (non HMO) | Admitting: Family Medicine

## 2017-12-27 VITALS — BP 102/62 | HR 66 | Temp 98.1°F | Ht 72.0 in | Wt 220.2 lb

## 2017-12-27 DIAGNOSIS — Z1211 Encounter for screening for malignant neoplasm of colon: Secondary | ICD-10-CM | POA: Diagnosis not present

## 2017-12-27 DIAGNOSIS — Z789 Other specified health status: Secondary | ICD-10-CM | POA: Diagnosis not present

## 2017-12-27 DIAGNOSIS — Z0000 Encounter for general adult medical examination without abnormal findings: Secondary | ICD-10-CM | POA: Diagnosis not present

## 2017-12-27 NOTE — Progress Notes (Signed)
Patient: Caleb Lopez, Male    DOB: 01-31-1961, 57 y.o.   MRN: [id number] Visit Date: 12/27/2017   Today's Provider: Vernie Murders, PA   Chief Complaint  Patient presents with  . Annual Exam   Subjective:    Annual physical exam Caleb Lopez is a 56 y.o. male who presents today for health maintenance and complete physical. He feels well. He reports exercising none. He reports he is sleeping well.  -----------------------------------------------------------------   Review of Systems  Constitutional: Negative.   HENT: Negative.   Eyes: Negative.   Respiratory: Negative.   Cardiovascular: Negative.   Gastrointestinal: Negative.   Endocrine: Positive for polyphagia.  Genitourinary: Negative.   Musculoskeletal: Negative.   Skin: Negative.   Allergic/Immunologic: Negative.   Neurological: Negative.   Hematological: Negative.   Psychiatric/Behavioral: Negative.    Social History      He  reports that he quit smoking about 29 years ago. He has never used smokeless tobacco. He reports that he drinks alcohol. He reports that he does not use drugs.       Social History   Socioeconomic History  . Marital status: Married    Spouse name: Not on file  . Number of children: Not on file  . Years of education: Not on file  . Highest education level: Not on file  Occupational History  . Not on file  Social Needs  . Financial resource strain: Not on file  . Food insecurity:    Worry: Not on file    Inability: Not on file  . Transportation needs:    Medical: Not on file    Non-medical: Not on file  Tobacco Use  . Smoking status: Former Smoker    Last attempt to quit: 09/11/1988    Years since quitting: 29.3  . Smokeless tobacco: Never Used  Substance and Sexual Activity  . Alcohol use: Yes    Comment: occasional  . Drug use: No  . Sexual activity: Not on file  Lifestyle  . Physical activity:    Days per week: Not on file    Minutes per session: Not on file    . Stress: Not on file  Relationships  . Social connections:    Talks on phone: Not on file    Gets together: Not on file    Attends religious service: Not on file    Active member of club or organization: Not on file    Attends meetings of clubs or organizations: Not on file    Relationship status: Not on file  Other Topics Concern  . Not on file  Social History Narrative  . Not on file   Patient Active Problem List   Diagnosis Date Noted  . Obesity 06/21/2015  . Coronary artery disease 06/21/2015  . Adult BMI 36.0-36.9 kg/sq m 06/18/2015  . Adiposity 06/18/2015  . Lump in scrotum 06/18/2015  . Acquired deformity of head 06/18/2015  . Strict vegetarian diet 06/18/2015  . Pericarditis 08/12/2008  . Apnea, sleep 08/12/2008  . Allergic rhinitis 02/13/2008  . LBP (low back pain) 02/13/2008   Past Surgical History:  Procedure Laterality Date  . THORACENTESIS  1980'S   x's 2 probably infection  . TONSILLECTOMY AND ADENOIDECTOMY  1960'S   Family History        Family Status  Relation Name Status  . Mother  Alive  . Father  Alive  . Sister ##Sister1 Alive       pituitary adenoma  His family history includes Arthritis in his mother; Diabetes in his father; Diverticulitis in his mother; Glaucoma in his mother.     No Known Allergies  No current outpatient medications on file.   Patient Care Team: Jabrea Kallstrom, Vickki Muff, PA as PCP - General (Family Medicine)      Objective:   Vitals: BP 102/62 (BP Location: Right Arm, Patient Position: Sitting, Cuff Size: Normal)   Pulse 66   Temp 98.1 F (36.7 C) (Oral)   Ht 6' (1.829 m)   Wt 220 lb 3.2 oz (99.9 kg)   SpO2 98%   BMI 29.86 kg/m   Physical Exam  Constitutional: He is oriented to person, place, and time. He appears well-developed and well-nourished.  HENT:  Head: Normocephalic and atraumatic.  Right Ear: External ear normal.  Left Ear: External ear normal.  Nose: Nose normal.  Mouth/Throat: Oropharynx is  clear and moist.  Eyes: Pupils are equal, round, and reactive to light. Conjunctivae and EOM are normal. Right eye exhibits no discharge.  Neck: Normal range of motion. Neck supple. No tracheal deviation present. No thyromegaly present.  Cardiovascular: Normal rate, regular rhythm, normal heart sounds and intact distal pulses.  No murmur heard. Pulmonary/Chest: Effort normal and breath sounds normal. No respiratory distress. He has no wheezes. He has no rales. He exhibits no tenderness.  Abdominal: Soft. He exhibits no distension and no mass. There is no tenderness. There is no rebound and no guarding.  Umbilical scar from herniorrhaphy.  Genitourinary: Penis normal.  Genitourinary Comments: Varicocele on the left.  Musculoskeletal: Normal range of motion. He exhibits no edema or tenderness.  Lymphadenopathy:    He has no cervical adenopathy.  Neurological: He is alert and oriented to person, place, and time. He has normal reflexes. He displays normal reflexes. No cranial nerve deficit. He exhibits normal muscle tone. Coordination normal.  Skin: Skin is warm and dry. No rash noted. No erythema.  Psychiatric: He has a normal mood and affect. His behavior is normal. Judgment and thought content normal.    Depression Screen PHQ 2/9 Scores 12/27/2017 06/19/2016  PHQ - 2 Score 0 0  PHQ- 9 Score 0 -   Assessment & Plan:     Routine Health Maintenance and Physical Exam  Exercise Activities and Dietary recommendations Goals    None     Immunization History  Administered Date(s) Administered  . Influenza,inj,Quad PF,6+ Mos 07/30/2014, 07/04/2017  . Tdap 04/29/2012  . Zoster 04/29/2012    Health Maintenance  Topic Date Due  . COLONOSCOPY  04/01/2017  . INFLUENZA VACCINE  04/11/2018  . TETANUS/TDAP  04/29/2022  . Hepatitis C Screening  Completed  . HIV Screening  Completed     Discussed health benefits of physical activity, and encouraged him to engage in regular exercise  appropriate for his age and condition.    -------------------------------------------------------------------- 1. Annual physical exam General health stable. Keeps weight down with a strict low calorie vegetarian diet. Immunizations essentially up to date. Will check routine labs and hgb A1C since his father and a paternal uncle were diabetics. Recheck pending lab reports. - Lipid Profile - CBC with Differential - TSH - Comprehensive metabolic panel - PSA - HgB A1c  2. Colon cancer screening Has a history of diverticulosis on colonoscopy by Dr. Allen Norris (GI) on 04-02-07. Wants to check about insurance coverage with wife before scheduling screening colonoscopy.  3. Strict vegetarian diet Eats one meal a day. Mostly vegetables but occasionally some cheeses. Father was a  diabetic with history of hepatitis before he died a month ago. Will check labs for metabolic disorders and nutritional imbalances. - CBC with Differential - TSH - Comprehensive metabolic panel - HgB V6P    Vernie Murders, PA  [facility]

## 2017-12-28 ENCOUNTER — Telehealth: Payer: Self-pay | Admitting: Emergency Medicine

## 2017-12-28 LAB — COMPREHENSIVE METABOLIC PANEL
ALT: 13 IU/L (ref 0–44)
AST: 12 IU/L (ref 0–40)
Albumin/Globulin Ratio: 2.3 — ABNORMAL HIGH (ref 1.2–2.2)
Albumin: 4.5 g/dL (ref 3.5–5.5)
Alkaline Phosphatase: 61 IU/L (ref 39–117)
BUN/Creatinine Ratio: 17 (ref 9–20)
BUN: 13 mg/dL (ref 6–24)
Bilirubin Total: 0.4 mg/dL (ref 0.0–1.2)
CO2: 21 mmol/L (ref 20–29)
Calcium: 9.3 mg/dL (ref 8.7–10.2)
Chloride: 107 mmol/L — ABNORMAL HIGH (ref 96–106)
Creatinine, Ser: 0.77 mg/dL (ref 0.76–1.27)
GFR calc Af Amer: 116 mL/min/{1.73_m2} (ref >59)
GFR calc non Af Amer: 100 mL/min/{1.73_m2} (ref >59)
Globulin, Total: 2 g/dL (ref 1.5–4.5)
Glucose: 85 mg/dL (ref 65–99)
Potassium: 4.3 mmol/L (ref 3.5–5.2)
Sodium: 141 mmol/L (ref 134–144)
Total Protein: 6.5 g/dL (ref 6.0–8.5)

## 2017-12-28 LAB — PSA: Prostate Specific Ag, Serum: 0.9 ng/mL (ref 0.0–4.0)

## 2017-12-28 LAB — CBC WITH DIFFERENTIAL/PLATELET
Basophils Absolute: 0 10*3/uL (ref 0.0–0.2)
Basos: 0 %
EOS (ABSOLUTE): 0.1 10*3/uL (ref 0.0–0.4)
Eos: 2 %
Hematocrit: 41.3 % (ref 37.5–51.0)
Hemoglobin: 14.5 g/dL (ref 13.0–17.7)
Immature Grans (Abs): 0 10*3/uL (ref 0.0–0.1)
Immature Granulocytes: 0 %
Lymphocytes Absolute: 1.3 10*3/uL (ref 0.7–3.1)
Lymphs: 24 %
MCH: 32.1 pg (ref 26.6–33.0)
MCHC: 35.1 g/dL (ref 31.5–35.7)
MCV: 91 fL (ref 79–97)
Monocytes Absolute: 0.6 10*3/uL (ref 0.1–0.9)
Monocytes: 12 %
Neutrophils Absolute: 3.3 10*3/uL (ref 1.4–7.0)
Neutrophils: 62 %
Platelets: 192 10*3/uL (ref 150–379)
RBC: 4.52 x10E6/uL (ref 4.14–5.80)
RDW: 14.5 % (ref 12.3–15.4)
WBC: 5.3 10*3/uL (ref 3.4–10.8)

## 2017-12-28 LAB — LIPID PANEL
Chol/HDL Ratio: 4 ratio (ref 0.0–5.0)
Cholesterol, Total: 179 mg/dL (ref 100–199)
HDL: 45 mg/dL (ref >39)
LDL Calculated: 113 mg/dL — ABNORMAL HIGH (ref 0–99)
Triglycerides: 107 mg/dL (ref 0–149)
VLDL Cholesterol Cal: 21 mg/dL (ref 5–40)

## 2017-12-28 LAB — HEMOGLOBIN A1C
Est. average glucose Bld gHb Est-mCnc: 108 mg/dL
Hgb A1c MFr Bld: 5.4 % (ref 4.8–5.6)

## 2017-12-28 LAB — TSH: TSH: 2.25 u[IU]/mL (ref 0.450–4.500)

## 2017-12-28 NOTE — Telephone Encounter (Signed)
-----   Message from Margo Common, [place] sent at 12/28/2017  8:18 AM EDT ----- Labs essentially normal with only mild variations of chloride and LDL cholesterol. Let us know when to schedule screening colonoscopy with Dr. Allen Norris. Keep annual appointment for CPE.

## 2017-12-28 NOTE — Telephone Encounter (Signed)
Tried to call pt. VM not set up. Will call again later.

## 2018-01-03 NOTE — Telephone Encounter (Signed)
Attempted to contact patient. No answer and voicemail is not set up.  

## 2018-01-07 NOTE — Telephone Encounter (Signed)
No voicemail set up 

## 2018-01-07 NOTE — Telephone Encounter (Signed)
Letter mailed to patient's address in chart advising patient to contact the office for lab results.

## 2018-01-10 ENCOUNTER — Encounter: Payer: Self-pay | Admitting: Family Medicine

## 2018-01-10 ENCOUNTER — Ambulatory Visit: Payer: Managed Care, Other (non HMO) | Admitting: Family Medicine

## 2018-01-10 VITALS — BP 102/70 | HR 66 | Temp 98.4°F | Wt 220.8 lb

## 2018-01-10 DIAGNOSIS — Z789 Other specified health status: Secondary | ICD-10-CM | POA: Diagnosis not present

## 2018-01-10 NOTE — Progress Notes (Signed)
Patient: Caleb Lopez Male    DOB: 1961-04-08   57 y.o.   MRN: [id number] Visit Date: 01/10/2018  Today's Provider: Vernie Murders, PA   Chief Complaint  Patient presents with  . Discuss lab results   Subjective:    HPI Patient presents today to discuss labs that were done on 12/27/17. Labs were essentially normal with only mild variations of chloride and LDL cholesterol. Patient states he was previously taking Vitamins and his wife complained the cost was to high. He states he has not taken the Vitamins in months. He wants to be sure he does not need to restart any medications based on lab results.     History reviewed. No pertinent past medical history. Patient Active Problem List   Diagnosis Date Noted  . Obesity 06/21/2015  . Coronary artery disease 06/21/2015  . Adult BMI 36.0-36.9 kg/sq m 06/18/2015  . Adiposity 06/18/2015  . Lump in scrotum 06/18/2015  . Acquired deformity of head 06/18/2015  . Strict vegetarian diet 06/18/2015  . Pericarditis 08/12/2008  . Apnea, sleep 08/12/2008  . Allergic rhinitis 02/13/2008  . LBP (low back pain) 02/13/2008   Past Surgical History:  Procedure Laterality Date  . THORACENTESIS  1980'S   x's 2 probably infection  . TONSILLECTOMY AND ADENOIDECTOMY  1960'S   Family History  Problem Relation Age of Onset  . Diverticulitis Mother   . Arthritis Mother   . Glaucoma Mother   . Diabetes Father    No Known Allergies  No current outpatient medications on file.  Review of Systems  Constitutional: Negative.   Respiratory: Negative.   Cardiovascular: Negative.    Social History   Tobacco Use  . Smoking status: Former Smoker    Last attempt to quit: 09/11/1988    Years since quitting: 29.3  . Smokeless tobacco: Never Used  Substance Use Topics  . Alcohol use: Yes    Comment: occasional   Objective:   BP 102/70 (BP Location: Right Arm, Patient Position: Sitting, Cuff Size: Normal)   Pulse 66   Temp 98.4 F (36.9 C)  (Oral)   Wt 220 lb 12.8 oz (100.2 kg)   SpO2 98%   BMI 29.95 kg/m   Physical Exam  Constitutional: He is oriented to person, place, and time. He appears well-developed and well-nourished. No distress.  HENT:  Head: Normocephalic and atraumatic.  Right Ear: Hearing normal.  Left Ear: Hearing normal.  Nose: Nose normal.  Eyes: Conjunctivae and lids are normal. Right eye exhibits no discharge. Left eye exhibits no discharge. No scleral icterus.  Pulmonary/Chest: Effort normal. No respiratory distress.  Musculoskeletal: Normal range of motion.  Neurological: He is alert and oriented to person, place, and time.  Skin: Skin is intact. No lesion and no rash noted.  Psychiatric: He has a normal mood and affect. His speech is normal and behavior is normal. Thought content normal.      Assessment & Plan:     1. Strict vegetarian diet Feeling well and taking Centrum Silver for men 50+. Labs were all normal on 12-27-17. Continue present regimen and recheck annually. Recent Results (from the past 2160 hour(s))  Lipid Profile     Status: Abnormal   Collection Time: 12/27/17  4:21 PM  Result Value Ref Range   Cholesterol, Total 179 100 - 199 mg/dL   Triglycerides 107 0 - 149 mg/dL   HDL 45 >39 mg/dL   VLDL Cholesterol Cal 21 5 - 40 mg/dL  LDL Calculated 113 (H) 0 - 99 mg/dL   Chol/HDL Ratio 4.0 0.0 - 5.0 ratio    Comment:                                   T. Chol/HDL Ratio                                             Men  Women                               1/2 Avg.Risk  3.4    3.3                                   Avg.Risk  5.0    4.4                                2X Avg.Risk  9.6    7.1                                3X Avg.Risk 23.4   11.0   CBC with Differential     Status: None   Collection Time: 12/27/17  4:21 PM  Result Value Ref Range   WBC 5.3 3.4 - 10.8 x10E3/uL   RBC 4.52 4.14 - 5.80 x10E6/uL   Hemoglobin 14.5 13.0 - 17.7 g/dL   Hematocrit 41.3 37.5 - 51.0 %   MCV 91 79 -  97 fL   MCH 32.1 26.6 - 33.0 pg   MCHC 35.1 31.5 - 35.7 g/dL   RDW 14.5 12.3 - 15.4 %   Platelets 192 150 - 379 x10E3/uL   Neutrophils 62 Not Estab. %   Lymphs 24 Not Estab. %   Monocytes 12 Not Estab. %   Eos 2 Not Estab. %   Basos 0 Not Estab. %   Neutrophils Absolute 3.3 1.4 - 7.0 x10E3/uL   Lymphocytes Absolute 1.3 0.7 - 3.1 x10E3/uL   Monocytes Absolute 0.6 0.1 - 0.9 x10E3/uL   EOS (ABSOLUTE) 0.1 0.0 - 0.4 x10E3/uL   Basophils Absolute 0.0 0.0 - 0.2 x10E3/uL   Immature Granulocytes 0 Not Estab. %   Immature Grans (Abs) 0.0 0.0 - 0.1 x10E3/uL  TSH     Status: None   Collection Time: 12/27/17  4:21 PM  Result Value Ref Range   TSH 2.250 0.450 - 4.500 uIU/mL  Comprehensive metabolic panel     Status: Abnormal   Collection Time: 12/27/17  4:21 PM  Result Value Ref Range   Glucose 85 65 - 99 mg/dL   BUN 13 6 - 24 mg/dL   Creatinine, Ser 0.77 0.76 - 1.27 mg/dL   GFR calc non Af Amer 100 >59 mL/min/1.73   GFR calc Af Amer 116 >59 mL/min/1.73   BUN/Creatinine Ratio 17 9 - 20   Sodium 141 134 - 144 mmol/L   Potassium 4.3 3.5 - 5.2 mmol/L   Chloride 107 (H) 96 - 106 mmol/L   CO2 21 20 - 29 mmol/L   Calcium 9.3 8.7 - 10.2 mg/dL  Total Protein 6.5 6.0 - 8.5 g/dL   Albumin 4.5 3.5 - 5.5 g/dL   Globulin, Total 2.0 1.5 - 4.5 g/dL   Albumin/Globulin Ratio 2.3 (H) 1.2 - 2.2   Bilirubin Total 0.4 0.0 - 1.2 mg/dL   Alkaline Phosphatase 61 39 - 117 IU/L   AST 12 0 - 40 IU/L   ALT 13 0 - 44 IU/L  PSA     Status: None   Collection Time: 12/27/17  4:21 PM  Result Value Ref Range   Prostate Specific Ag, Serum 0.9 0.0 - 4.0 ng/mL    Comment: Roche ECLIA methodology. According to the American Urological Association, Serum PSA should decrease and remain at undetectable levels after radical prostatectomy. The AUA defines biochemical recurrence as an initial PSA value 0.2 ng/mL or greater followed by a subsequent confirmatory PSA value 0.2 ng/mL or greater. Values obtained with  different assay methods or kits cannot be used interchangeably. Results cannot be interpreted as absolute evidence of the presence or absence of malignant disease.   HgB A1c     Status: None   Collection Time: 12/27/17  4:21 PM  Result Value Ref Range   Hgb A1c MFr Bld 5.4 4.8 - 5.6 %    Comment:          Prediabetes: 5.7 - 6.4          Diabetes: >6.4          Glycemic control for adults with diabetes: <7.0    Est. average glucose Bld gHb Est-mCnc 108 mg/dL     2. LDL-c greater than or equal to 100 mg/dl LDL 113 on 12-27-17. Continue MegaRed daily and exercise regularly. Recheck annually. Reduce cheese intake. Counseled regarding diet and exercise for 20-30 minutes.       Vernie Murders, PA  [facility]

## 2019-02-13 ENCOUNTER — Other Ambulatory Visit: Payer: Self-pay

## 2019-02-13 ENCOUNTER — Encounter: Payer: Self-pay | Admitting: Family Medicine

## 2019-02-13 ENCOUNTER — Ambulatory Visit (INDEPENDENT_AMBULATORY_CARE_PROVIDER_SITE_OTHER): Payer: Managed Care, Other (non HMO) | Admitting: Family Medicine

## 2019-02-13 VITALS — BP 118/78 | HR 77 | Temp 98.3°F | Ht 72.0 in | Wt 250.0 lb

## 2019-02-13 DIAGNOSIS — Z0000 Encounter for general adult medical examination without abnormal findings: Secondary | ICD-10-CM

## 2019-02-13 DIAGNOSIS — Z1211 Encounter for screening for malignant neoplasm of colon: Secondary | ICD-10-CM

## 2019-02-13 DIAGNOSIS — Z125 Encounter for screening for malignant neoplasm of prostate: Secondary | ICD-10-CM | POA: Diagnosis not present

## 2019-02-13 NOTE — Progress Notes (Signed)
Patient: Caleb Lopez, Male    DOB: 10-30-1960, 58 y.o.   MRN: [id number] Visit Date: 02/13/2019  Today's Provider: Vernie Murders, PA   Chief Complaint  Patient presents with  . Annual Exam   Subjective:  Caleb Lopez is a 58 y.o. male who presents today for health maintenance and complete physical. He feels well. He reports exercising none. He reports he is sleeping well.  04/02/07 Colonoscopy, Dr Wohl-Diverticulosis  Review of Systems  Constitutional: Negative.   HENT: Negative.   Eyes: Negative.   Respiratory: Negative.   Cardiovascular: Negative.   Gastrointestinal: Negative.   Endocrine: Negative.   Genitourinary: Negative.   Musculoskeletal: Negative.   Skin: Negative.   Allergic/Immunologic: Negative.   Neurological: Negative.   Hematological: Negative.   Psychiatric/Behavioral: Negative.     Social History   Socioeconomic History  . Marital status: Married    Spouse name: Not on file  . Number of children: Not on file  . Years of education: Not on file  . Highest education level: Not on file  Occupational History  . Not on file  Social Needs  . Financial resource strain: Not on file  . Food insecurity:    Worry: Not on file    Inability: Not on file  . Transportation needs:    Medical: Not on file    Non-medical: Not on file  Tobacco Use  . Smoking status: Former Smoker    Last attempt to quit: 09/11/1988    Years since quitting: 30.4  . Smokeless tobacco: Never Used  Substance and Sexual Activity  . Alcohol use: Yes    Comment: occasional  . Drug use: No  . Sexual activity: Not on file  Lifestyle  . Physical activity:    Days per week: Not on file    Minutes per session: Not on file  . Stress: Not on file  Relationships  . Social connections:    Talks on phone: Not on file    Gets together: Not on file    Attends religious service: Not on file    Active member of club or organization: Not on file    Attends meetings of clubs or  organizations: Not on file    Relationship status: Not on file  . Intimate partner violence:    Fear of current or ex partner: Not on file    Emotionally abused: Not on file    Physically abused: Not on file    Forced sexual activity: Not on file  Other Topics Concern  . Not on file  Social History Narrative  . Not on file   Patient Active Problem List   Diagnosis Date Noted  . Obesity 06/21/2015  . Coronary artery disease 06/21/2015  . Adult BMI 36.0-36.9 kg/sq m 06/18/2015  . Adiposity 06/18/2015  . Lump in scrotum 06/18/2015  . Acquired deformity of head 06/18/2015  . Strict vegetarian diet 06/18/2015  . Pericarditis 08/12/2008  . Apnea, sleep 08/12/2008  . Allergic rhinitis 02/13/2008  . LBP (low back pain) 02/13/2008   Past Surgical History:  Procedure Laterality Date  . THORACENTESIS  1980'S   x's 2 probably infection  . TONSILLECTOMY AND ADENOIDECTOMY  1960'S    His family history includes Arthritis in his mother; Diabetes in his father; Diverticulitis in his mother; Glaucoma in his mother.     No outpatient encounter medications on file as of 02/13/2019.   No facility-administered encounter medications on file as of 02/13/2019.  Patient Care Team: Altagracia Rone, Vickki Muff, PA as PCP - General (Family Medicine)      Objective:   Vitals:  Vitals:   02/13/19 1337  BP: 118/78  Pulse: 77  Temp: 98.3 F (36.8 C)  TempSrc: Oral  SpO2: 99%  Weight: 250 lb (113.4 kg)  Height: 6' (1.829 m)   Wt Readings from Last 3 Encounters:  02/13/19 250 lb (113.4 kg)  01/10/18 220 lb 12.8 oz (100.2 kg)  12/27/17 220 lb 3.2 oz (99.9 kg)   Physical Exam Constitutional:      Appearance: He is well-developed.  HENT:     Head: Normocephalic and atraumatic.     Right Ear: External ear normal.     Left Ear: External ear normal.     Nose: Nose normal.  Eyes:     General:        Right eye: No discharge.     Conjunctiva/sclera: Conjunctivae normal.     Pupils: Pupils are  equal, round, and reactive to light.  Neck:     Musculoskeletal: Normal range of motion and neck supple.     Thyroid: No thyromegaly.     Trachea: No tracheal deviation.  Cardiovascular:     Rate and Rhythm: Normal rate and regular rhythm.     Heart sounds: Normal heart sounds. No murmur.  Pulmonary:     Effort: Pulmonary effort is normal. No respiratory distress.     Breath sounds: Normal breath sounds. No wheezing or rales.  Chest:     Chest wall: No tenderness.  Abdominal:     General: There is no distension.     Palpations: Abdomen is soft. There is no mass.     Tenderness: There is no abdominal tenderness. There is no guarding or rebound.  Genitourinary:    Penis: Normal.      Prostate: Normal.     Rectum: Normal. Guaiac result negative.     Comments: Left varicocele unchanged - asymptomatic. Musculoskeletal: Normal range of motion.        General: No tenderness.  Lymphadenopathy:     Cervical: No cervical adenopathy.  Skin:    General: Skin is warm and dry.     Findings: No erythema or rash.  Neurological:     Mental Status: He is alert and oriented to person, place, and time.     Cranial Nerves: No cranial nerve deficit.     Motor: No abnormal muscle tone.     Coordination: Coordination normal.     Deep Tendon Reflexes: Reflexes are normal and symmetric. Reflexes normal.  Psychiatric:        Behavior: Behavior normal.        Thought Content: Thought content normal.        Judgment: Judgment normal.     Fall Risk  02/13/2019 12/27/2017  Falls in the past year? 0 No   Depression Screen PHQ 2/9 Scores 02/13/2019 12/27/2017 06/19/2016  PHQ - 2 Score 0 0 0  PHQ- 9 Score - 0 -   Functional Status Survey: Is the patient deaf or have difficulty hearing?: No Does the patient have difficulty seeing, even when wearing glasses/contacts?: No Does the patient have difficulty concentrating, remembering, or making decisions?: No Does the patient have difficulty walking or  climbing stairs?: No Does the patient have difficulty dressing or bathing?: No Does the patient have difficulty doing errands alone such as visiting a doctor's office or shopping?: No    Office Visit from 02/13/2019 in [facility]  Practice  AUDIT-C Score  0     Current Exercise Habits: The patient does not participate in regular exercise at present     Assessment & Plan:     Routine Health Maintenance and Physical Exam  Exercise Activities and Dietary recommendations Goals   Recommend exercising  30-40 minutes 3-4 days a week and reduce simple carbohydrates in diet.     Immunization History  Administered Date(s) Administered  . Influenza,inj,Quad PF,6+ Mos 07/30/2014, 07/04/2017  . Tdap 04/29/2012  . Zoster 04/29/2012    Health Maintenance  Topic Date Due  . COLONOSCOPY  04/01/2017  . INFLUENZA VACCINE  04/12/2019  . TETANUS/TDAP  04/29/2022  . Hepatitis C Screening  Completed  . HIV Screening  Completed     Discussed health benefits of physical activity, and encouraged him to engage in regular exercise appropriate for his age and condition.    1. Annual physical exam General health stable. Immunizations up to date. Recommend weight loss and continue stretching exercises and vegetarian diet with less fats. Recheck labs. Given anticipatory guidance. Schedule colonoscopy. - CBC with Differential/Platelet - Comprehensive metabolic panel - Lipid Panel With LDL/HDL Ratio - TSH  2. Prostate cancer screening Denies nocturia, hesitancy or frequency. Check PSA. - PSA  3. Colon cancer screening - Ambulatory referral to Gastroenterology

## 2019-02-14 ENCOUNTER — Telehealth: Payer: Self-pay

## 2019-02-14 LAB — CBC WITH DIFFERENTIAL/PLATELET
Basophils Absolute: 0 10*3/uL (ref 0.0–0.2)
Basos: 1 %
EOS (ABSOLUTE): 0.1 10*3/uL (ref 0.0–0.4)
Eos: 1 %
Hematocrit: 44.4 % (ref 37.5–51.0)
Hemoglobin: 15.6 g/dL (ref 13.0–17.7)
Immature Grans (Abs): 0 10*3/uL (ref 0.0–0.1)
Immature Granulocytes: 1 %
Lymphocytes Absolute: 1.3 10*3/uL (ref 0.7–3.1)
Lymphs: 23 %
MCH: 31.6 pg (ref 26.6–33.0)
MCHC: 35.1 g/dL (ref 31.5–35.7)
MCV: 90 fL (ref 79–97)
Monocytes Absolute: 0.6 10*3/uL (ref 0.1–0.9)
Monocytes: 11 %
Neutrophils Absolute: 3.6 10*3/uL (ref 1.4–7.0)
Neutrophils: 63 %
Platelets: 242 10*3/uL (ref 150–450)
RBC: 4.93 x10E6/uL (ref 4.14–5.80)
RDW: 12.8 % (ref 11.6–15.4)
WBC: 5.6 10*3/uL (ref 3.4–10.8)

## 2019-02-14 LAB — TSH: TSH: 2.15 u[IU]/mL (ref 0.450–4.500)

## 2019-02-14 LAB — COMPREHENSIVE METABOLIC PANEL
ALT: 10 IU/L (ref 0–44)
AST: 13 IU/L (ref 0–40)
Albumin/Globulin Ratio: 2.1 (ref 1.2–2.2)
Albumin: 4.4 g/dL (ref 3.8–4.9)
Alkaline Phosphatase: 84 IU/L (ref 39–117)
BUN/Creatinine Ratio: 15 (ref 9–20)
BUN: 14 mg/dL (ref 6–24)
Bilirubin Total: 0.3 mg/dL (ref 0.0–1.2)
CO2: 19 mmol/L — ABNORMAL LOW (ref 20–29)
Calcium: 9.4 mg/dL (ref 8.7–10.2)
Chloride: 106 mmol/L (ref 96–106)
Creatinine, Ser: 0.91 mg/dL (ref 0.76–1.27)
GFR calc Af Amer: 106 mL/min/{1.73_m2} (ref >59)
GFR calc non Af Amer: 92 mL/min/{1.73_m2} (ref >59)
Globulin, Total: 2.1 g/dL (ref 1.5–4.5)
Glucose: 87 mg/dL (ref 65–99)
Potassium: 4.6 mmol/L (ref 3.5–5.2)
Sodium: 140 mmol/L (ref 134–144)
Total Protein: 6.5 g/dL (ref 6.0–8.5)

## 2019-02-14 LAB — LIPID PANEL WITH LDL/HDL RATIO
Cholesterol, Total: 210 mg/dL — ABNORMAL HIGH (ref 100–199)
HDL: 38 mg/dL — ABNORMAL LOW (ref >39)
LDL Calculated: 150 mg/dL — ABNORMAL HIGH (ref 0–99)
LDl/HDL Ratio: 3.9 ratio — ABNORMAL HIGH (ref 0.0–3.6)
Triglycerides: 108 mg/dL (ref 0–149)
VLDL Cholesterol Cal: 22 mg/dL (ref 5–40)

## 2019-02-14 LAB — PSA: Prostate Specific Ag, Serum: 1.4 ng/mL (ref 0.0–4.0)

## 2019-02-14 NOTE — Telephone Encounter (Signed)
Patient called back. He states that he would like to come in and discuss lab results. Appointment made.

## 2019-02-14 NOTE — Telephone Encounter (Signed)
-----   Message from Margo Common, [place] sent at 02/14/2019  9:41 AM EDT ----- All tests are normal except total cholesterol and LDL high with low HDL. Lowering fats in diet and continuing daily exercises will help. Probably will need some extra help from Red Yeast Rice with Co-Q 10 600 mg qd. Recheck progress of these levels in 3-4 months.

## 2019-02-14 NOTE — Telephone Encounter (Signed)
No voicemail set up 

## 2019-02-18 ENCOUNTER — Encounter: Payer: Self-pay | Admitting: Family Medicine

## 2019-02-18 ENCOUNTER — Ambulatory Visit: Payer: Managed Care, Other (non HMO) | Admitting: Family Medicine

## 2019-02-18 ENCOUNTER — Other Ambulatory Visit: Payer: Self-pay

## 2019-02-18 VITALS — BP 122/80 | HR 74 | Temp 98.4°F | Wt 250.0 lb

## 2019-02-18 DIAGNOSIS — E7800 Pure hypercholesterolemia, unspecified: Secondary | ICD-10-CM

## 2019-02-18 NOTE — Progress Notes (Signed)
Caleb Lopez  MRN: [id number] DOB: 09-28-1960  Subjective:  HPI   The patient is a 58 year old male who presents to review labs with his provider.  His lipids were elevated and it was recommended that he start with some Red Yeast Rice.  Patient Active Problem List   Diagnosis Date Noted  . Obesity 06/21/2015  . Coronary artery disease 06/21/2015  . Adult BMI 36.0-36.9 kg/sq m 06/18/2015  . Adiposity 06/18/2015  . Lump in scrotum 06/18/2015  . Acquired deformity of head 06/18/2015  . Strict vegetarian diet 06/18/2015  . Pericarditis 08/12/2008  . Apnea, sleep 08/12/2008  . Allergic rhinitis 02/13/2008  . LBP (low back pain) 02/13/2008   No past medical history on file.  Social History   Socioeconomic History  . Marital status: Married    Spouse name: Not on file  . Number of children: Not on file  . Years of education: Not on file  . Highest education level: Not on file  Occupational History  . Not on file  Social Needs  . Financial resource strain: Not on file  . Food insecurity:    Worry: Not on file    Inability: Not on file  . Transportation needs:    Medical: Not on file    Non-medical: Not on file  Tobacco Use  . Smoking status: Former Smoker    Last attempt to quit: 09/11/1988    Years since quitting: 30.4  . Smokeless tobacco: Never Used  Substance and Sexual Activity  . Alcohol use: Yes    Comment: occasional  . Drug use: No  . Sexual activity: Not on file  Lifestyle  . Physical activity:    Days per week: Not on file    Minutes per session: Not on file  . Stress: Not on file  Relationships  . Social connections:    Talks on phone: Not on file    Gets together: Not on file    Attends religious service: Not on file    Active member of club or organization: Not on file    Attends meetings of clubs or organizations: Not on file    Relationship status: Not on file  . Intimate partner violence:    Fear of current or ex partner: Not on file   Emotionally abused: Not on file    Physically abused: Not on file    Forced sexual activity: Not on file  Other Topics Concern  . Not on file  Social History Narrative  . Not on file   Past Surgical History:  Procedure Laterality Date  . THORACENTESIS  1980'S   x's 2 probably infection  . TONSILLECTOMY AND ADENOIDECTOMY  1960'S   No outpatient encounter medications on file as of 02/18/2019.   No facility-administered encounter medications on file as of 02/18/2019.    No Known Allergies  Review of Systems  Respiratory: Negative for cough, shortness of breath and wheezing.   Cardiovascular: Negative for chest pain, palpitations, claudication and leg swelling.    Objective:  BP 122/80 (BP Location: Right Arm, Patient Position: Sitting, Cuff Size: Normal)   Pulse 74   Temp 98.4 F (36.9 C) (Oral)   Wt 250 lb (113.4 kg)   SpO2 96%   BMI 33.91 kg/m  Wt Readings from Last 3 Encounters:  02/18/19 250 lb (113.4 kg)  02/13/19 250 lb (113.4 kg)  01/10/18 220 lb 12.8 oz (100.2 kg)   Physical Exam  Constitutional: He is oriented to  person, place, and time and well-developed, well-nourished, and in no distress.  HENT:  Head: Normocephalic.  Eyes: Conjunctivae are normal.  Neck: Neck supple.  Cardiovascular: Normal rate and regular rhythm.  Pulmonary/Chest: Effort normal and breath sounds normal.  Abdominal: Soft. Bowel sounds are normal.  Musculoskeletal: Normal range of motion.  Neurological: He is alert and oriented to person, place, and time.  Skin: No rash noted.  Psychiatric: Mood, affect and judgment normal.    Assessment and Plan :  1. Pure hypercholesterolemia Total cholesterol 210, triglycerides 108, HDL 38 and LDL 150 with risk ratio 3.9. Recommended to follow low fat diet restrictions, exercise regularly and try Red Yeast Rice with Co-Q 10 daily. (Counseling for 20 minutes.) Recheck levels in 4-6 months.

## 2019-04-07 ENCOUNTER — Encounter: Payer: Self-pay | Admitting: *Deleted

## 2019-04-17 ENCOUNTER — Other Ambulatory Visit: Payer: Self-pay

## 2019-04-17 ENCOUNTER — Telehealth: Payer: Self-pay

## 2019-04-17 DIAGNOSIS — Z1211 Encounter for screening for malignant neoplasm of colon: Secondary | ICD-10-CM

## 2019-04-17 NOTE — Telephone Encounter (Signed)
Gastroenterology Pre-Procedure Review  Request Date: 06/03/19 Requesting Physician: Dr. Allen Norris  PATIENT REVIEW QUESTIONS: The patient responded to the following health history questions as indicated:    1. Are you having any GI issues? no 2. Do you have a personal history of Polyps? no 3. Do you have a family history of Colon Cancer or Polyps? no 4. Diabetes Mellitus? no 5. Joint replacements in the past 12 months?no 6. Major health problems in the past 3 months?no 7. Any artificial heart valves, MVP, or defibrillator?no    MEDICATIONS & ALLERGIES:    Patient reports the following regarding taking any anticoagulation/antiplatelet therapy:   Plavix, Coumadin, Eliquis, Xarelto, Lovenox, Pradaxa, Brilinta, or Effient? no Aspirin? no  Patient confirms/reports the following medications:  No current outpatient medications on file.   No current facility-administered medications for this visit.     Patient confirms/reports the following allergies:  No Known Allergies  No orders of the defined types were placed in this encounter.   AUTHORIZATION INFORMATION Primary Insurance: 1D#: Group #:  Secondary Insurance: 1D#: Group #:  SCHEDULE INFORMATION: Date: 06/03/19 Time: Location:

## 2019-05-30 ENCOUNTER — Other Ambulatory Visit
Admission: RE | Admit: 2019-05-30 | Discharge: 2019-05-30 | Disposition: A | Discharge status: Home / Self Care | Payer: Managed Care, Other (non HMO) | Source: Ambulatory Visit | Attending: Gastroenterology | Admitting: Gastroenterology

## 2019-05-30 ENCOUNTER — Other Ambulatory Visit: Payer: Self-pay

## 2019-05-30 DIAGNOSIS — Z20828 Contact with and (suspected) exposure to other viral communicable diseases: Secondary | ICD-10-CM | POA: Diagnosis not present

## 2019-05-30 DIAGNOSIS — Z01812 Encounter for preprocedural laboratory examination: Secondary | ICD-10-CM | POA: Diagnosis not present

## 2019-05-30 LAB — SARS CORONAVIRUS 2 (TAT 6-24 HRS): SARS Coronavirus 2: NEGATIVE

## 2019-06-02 ENCOUNTER — Encounter: Payer: Self-pay | Admitting: *Deleted

## 2019-06-03 ENCOUNTER — Ambulatory Visit: Payer: Managed Care, Other (non HMO) | Admitting: Anesthesiology

## 2019-06-03 ENCOUNTER — Other Ambulatory Visit: Payer: Self-pay

## 2019-06-03 ENCOUNTER — Ambulatory Visit
Admission: RE | Admit: 2019-06-03 | Discharge: 2019-06-03 | Disposition: A | Discharge status: Home / Self Care | Payer: Managed Care, Other (non HMO) | Attending: Gastroenterology | Admitting: Gastroenterology

## 2019-06-03 ENCOUNTER — Encounter: Payer: Self-pay | Admitting: *Deleted

## 2019-06-03 ENCOUNTER — Encounter
Admission: RE | Disposition: A | Discharge status: Home / Self Care | Payer: Self-pay | Source: Home / Self Care | Attending: Gastroenterology

## 2019-06-03 DIAGNOSIS — Z87891 Personal history of nicotine dependence: Secondary | ICD-10-CM | POA: Insufficient documentation

## 2019-06-03 DIAGNOSIS — Z1211 Encounter for screening for malignant neoplasm of colon: Secondary | ICD-10-CM | POA: Diagnosis not present

## 2019-06-03 DIAGNOSIS — K648 Other hemorrhoids: Secondary | ICD-10-CM | POA: Diagnosis not present

## 2019-06-03 DIAGNOSIS — D120 Benign neoplasm of cecum: Secondary | ICD-10-CM

## 2019-06-03 DIAGNOSIS — K5730 Diverticulosis of large intestine without perforation or abscess without bleeding: Secondary | ICD-10-CM | POA: Insufficient documentation

## 2019-06-03 DIAGNOSIS — D123 Benign neoplasm of transverse colon: Secondary | ICD-10-CM | POA: Insufficient documentation

## 2019-06-03 DIAGNOSIS — K635 Polyp of colon: Secondary | ICD-10-CM

## 2019-06-03 DIAGNOSIS — D124 Benign neoplasm of descending colon: Secondary | ICD-10-CM | POA: Diagnosis not present

## 2019-06-03 DIAGNOSIS — D125 Benign neoplasm of sigmoid colon: Secondary | ICD-10-CM

## 2019-06-03 HISTORY — PX: COLONOSCOPY WITH PROPOFOL: SHX5780

## 2019-06-03 SURGERY — COLONOSCOPY WITH PROPOFOL
Anesthesia: General

## 2019-06-03 MED ORDER — SODIUM CHLORIDE 0.9 % IV SOLN
INTRAVENOUS | Status: DC
  Administered 2019-06-03: 09:00:00 1000 mL via INTRAVENOUS

## 2019-06-03 MED ORDER — PROPOFOL 500 MG/50ML IV EMUL
INTRAVENOUS | Status: DC | PRN
  Administered 2019-06-03: 150 ug/kg/min via INTRAVENOUS

## 2019-06-03 MED ORDER — PHENYLEPHRINE HCL (PRESSORS) 10 MG/ML IV SOLN
INTRAVENOUS | Status: DC | PRN
  Administered 2019-06-03: 100 ug via INTRAVENOUS

## 2019-06-03 MED ORDER — PROPOFOL 10 MG/ML IV BOLUS
INTRAVENOUS | Status: DC | PRN
  Administered 2019-06-03: 100 mg via INTRAVENOUS

## 2019-06-03 NOTE — Anesthesia Procedure Notes (Signed)
Date/Time: 06/03/2019 9:14 AM Performed by: Nelda Marseille, CRNA Pre-anesthesia Checklist: Patient identified, Emergency Drugs available, Suction available, Patient being monitored and Timeout performed Oxygen Delivery Method: Nasal cannula

## 2019-06-03 NOTE — Anesthesia Postprocedure Evaluation (Signed)
Anesthesia Post Note  Patient: Caleb Lopez  Procedure(s) Performed: COLONOSCOPY WITH PROPOFOL (N/A )  Patient location during evaluation: Endoscopy Anesthesia Type: General Level of consciousness: awake and alert and oriented Pain management: pain level controlled Vital Signs Assessment: post-procedure vital signs reviewed and stable Respiratory status: spontaneous breathing, nonlabored ventilation and respiratory function stable Cardiovascular status: blood pressure returned to baseline and stable Postop Assessment: no signs of nausea or vomiting Anesthetic complications: no     Last Vitals:  Vitals:   06/03/19 0945 06/03/19 0954  BP: 96/74 107/80  Pulse: 67 64  Resp: (!) 24 15  Temp:    SpO2: 98% 98%    Last Pain:  Vitals:   06/03/19 0954  TempSrc:   PainSc: 0-No pain                 Drae Mitzel

## 2019-06-03 NOTE — H&P (Signed)
Lucilla Lame, MD [address] [place], [place] [address] Phone: [phone] Fax : [phone]  Primary Care Physician:  Margo Common, [place] Primary Gastroenterologist:  Dr. Allen Norris  Pre-Procedure History & Physical: HPI:  Caleb Lopez is a 58 y.o. male is here for a screening colonoscopy.   History reviewed. No pertinent past medical history.  Past Surgical History:  Procedure Laterality Date  . THORACENTESIS  1980'S   x's 2 probably infection  . TONSILLECTOMY AND ADENOIDECTOMY  1960'S    Prior to Admission medications   Not on File    Allergies as of 04/18/2019  . (No Known Allergies)    Family History  Problem Relation Age of Onset  . Diverticulitis Mother   . Arthritis Mother   . Glaucoma Mother   . Diabetes Father     Social History   Socioeconomic History  . Marital status: Married    Spouse name: Not on file  . Number of children: Not on file  . Years of education: Not on file  . Highest education level: Not on file  Occupational History  . Not on file  Social Needs  . Financial resource strain: Not on file  . Food insecurity    Worry: Not on file    Inability: Not on file  . Transportation needs    Medical: Not on file    Non-medical: Not on file  Tobacco Use  . Smoking status: Former Smoker    Quit date: 09/11/1988    Years since quitting: 30.7  . Smokeless tobacco: Never Used  Substance and Sexual Activity  . Alcohol use: Yes    Comment: occasional  . Drug use: Never  . Sexual activity: Not on file  Lifestyle  . Physical activity    Days per week: Not on file    Minutes per session: Not on file  . Stress: Not on file  Relationships  . Social Herbalist on phone: Not on file    Gets together: Not on file    Attends religious service: Not on file    Active member of club or organization: Not on file    Attends meetings of clubs or organizations: Not on file    Relationship status: Not on file  .  Intimate partner violence    Fear of current or ex partner: Not on file    Emotionally abused: Not on file    Physically abused: Not on file    Forced sexual activity: Not on file  Other Topics Concern  . Not on file  Social History Narrative  . Not on file    Review of Systems: See HPI, otherwise negative ROS  Physical Exam: BP 100/73   Pulse 76   Temp (!) 97.2 F (36.2 C) (Tympanic)   Resp 18   Ht 6' (1.829 m)   Wt 117.9 kg   SpO2 100%   BMI 35.26 kg/m  General:   Alert,  pleasant and cooperative in NAD Head:  Normocephalic and atraumatic. Neck:  Supple; no masses or thyromegaly. Lungs:  Clear throughout to auscultation.    Heart:  Regular rate and rhythm. Abdomen:  Soft, nontender and nondistended. Normal bowel sounds, without guarding, and without rebound.   Neurologic:  Alert and  oriented x4;  grossly normal neurologically.  Impression/Plan: Caleb Lopez is now here to undergo a screening colonoscopy.  Risks, benefits, and alternatives regarding colonoscopy have been reviewed with the patient.  Questions  have been answered.  All parties agreeable.

## 2019-06-03 NOTE — Op Note (Signed)
[facility] Patient Name: Caleb Lopez Procedure Date: 06/03/2019 9:09 AM MRN: [id number] Account #: [id number] Date of Birth: [birth year] Admit Type: Outpatient Age: 58 Room: [facility] ENDO ROOM 4 Gender: Male Note Status: Finalized Procedure:            Colonoscopy Indications:          Screening for colorectal malignant neoplasm Providers:            Lucilla Lame MD, MD Referring MD:         Vickki Muff. Chrismon, MD (Referring MD) Medicines:            Propofol per Anesthesia Complications:        No immediate complications. Procedure:            Pre-Anesthesia Assessment:                       - Prior to the procedure, a History and Physical was                        performed, and patient medications and allergies were                        reviewed. The patient's tolerance of previous                        anesthesia was also reviewed. The risks and benefits of                        the procedure and the sedation options and risks were                        discussed with the patient. All questions were                        answered, and informed consent was obtained. Prior                        Anticoagulants: The patient has taken no previous                        anticoagulant or antiplatelet agents. ASA Grade                        Assessment: II - A patient with mild systemic disease.                        After reviewing the risks and benefits, the patient was                        deemed in satisfactory condition to undergo the                        procedure.                       After obtaining informed consent, the colonoscope was                        passed under direct vision. Throughout the procedure,  the patient's blood pressure, pulse, and oxygen                        saturations were monitored continuously. The                        Colonoscope was introduced through the anus and                         advanced to the the cecum, identified by appendiceal                        orifice and ileocecal valve. The colonoscopy was                        performed without difficulty. The patient tolerated the                        procedure well. The quality of the bowel preparation                        was excellent. Findings:      The perianal and digital rectal examinations were normal.      A 3 mm polyp was found in the cecum. The polyp was sessile. The polyp       was removed with a cold biopsy forceps. Resection and retrieval were       complete.      A 3 mm polyp was found in the transverse colon. The polyp was sessile.       The polyp was removed with a cold biopsy forceps. Resection and       retrieval were complete.      Two semi-pedunculated polyps were found in the sigmoid colon. The polyps       were 4 to 7 mm in size. These polyps were removed with a cold snare.       Resection and retrieval were complete.      A 5 mm polyp was found in the descending colon. The polyp was sessile.       The polyp was removed with a cold snare. Resection and retrieval were       complete.      Multiple small-mouthed diverticula were found in the sigmoid colon.      Non-bleeding internal hemorrhoids were found during retroflexion. The       hemorrhoids were Grade II (internal hemorrhoids that prolapse but reduce       spontaneously). Impression:           - One 3 mm polyp in the cecum, removed with a cold                        biopsy forceps. Resected and retrieved.                       - One 3 mm polyp in the transverse colon, removed with                        a cold biopsy forceps. Resected and retrieved.                       - Two 4 to 7 mm  polyps in the sigmoid colon, removed                        with a cold snare. Resected and retrieved.                       - One 5 mm polyp in the descending colon, removed with                        a cold snare. Resected and  retrieved.                       - Diverticulosis in the sigmoid colon.                       - Non-bleeding internal hemorrhoids. Recommendation:       - Discharge patient to home.                       - Resume previous diet.                       - Continue present medications.                       - Await pathology results.                       - Repeat colonoscopy in 5 years if polyp adenoma and 10                        years if hyperplastic Procedure Code(s):    --- Professional ---                       [phone], Colonoscopy, flexible; with removal of tumor(s),                        polyp(s), or other lesion(s) by snare technique                       45380, 103, Colonoscopy, flexible; with biopsy, single                        or multiple Diagnosis Code(s):    --- Professional ---                       Z12.11, Encounter for screening for malignant neoplasm                        of colon                       K63.5, Polyp of colon CPT copyright 2019 American Medical Association. All rights reserved. The codes documented in this report are preliminary and upon coder review may  be revised to meet current compliance requirements. Lucilla Lame MD, MD 06/03/2019 9:33:19 AM This report has been signed electronically. Number of Addenda: 0 Note Initiated On: 06/03/2019 9:09 AM Scope Withdrawal Time: 0 hours 10 minutes 55 seconds  Total Procedure Duration: 0 hours 15 minutes 19 seconds  Estimated Blood Loss: Estimated blood loss: none. Estimated blood loss: none.  [facility]

## 2019-06-03 NOTE — Transfer of Care (Signed)
Immediate Anesthesia Transfer of Care Note  Patient: Caleb Lopez  Procedure(s) Performed: COLONOSCOPY WITH PROPOFOL (N/A )  Patient Location: PACU  Anesthesia Type:General  Level of Consciousness: sedated  Airway & Oxygen Therapy: Patient Spontanous Breathing and Patient connected to nasal cannula oxygen  Post-op Assessment: Report given to RN and Post -op Vital signs reviewed and stable  Post vital signs: Reviewed and stable  Last Vitals:  Vitals Value Taken Time  BP 91/60 06/03/19 0934  Temp    Pulse 71 06/03/19 0935  Resp 23 06/03/19 0935  SpO2 97 % 06/03/19 0935  Vitals shown include unvalidated device data.  Last Pain:  Vitals:   06/03/19 0846  TempSrc: Tympanic  PainSc: 0-No pain         Complications: No apparent anesthesia complications

## 2019-06-03 NOTE — Anesthesia Preprocedure Evaluation (Signed)
Anesthesia Evaluation  Patient identified by MRN, date of birth, ID band Patient awake    Reviewed: Allergy & Precautions, NPO status , Patient's Chart, lab work & pertinent test results  History of Anesthesia Complications Negative for: history of anesthetic complications  Airway Mallampati: II  TM Distance: >3 FB Neck ROM: Full    Dental no notable dental hx.    Pulmonary neg sleep apnea, neg COPD, former smoker,    breath sounds clear to auscultation- rhonchi (-) wheezing      Cardiovascular Exercise Tolerance: Good (-) hypertension+ CAD (nonocclusive)  (-) Past MI, (-) Cardiac Stents and (-) CABG  Rhythm:Regular Rate:Normal - Systolic murmurs and - Diastolic murmurs    Neuro/Psych neg Seizures negative neurological ROS  negative psych ROS   GI/Hepatic negative GI ROS, Neg liver ROS,   Endo/Other  negative endocrine ROSneg diabetes  Renal/GU negative Renal ROS     Musculoskeletal negative musculoskeletal ROS (+)   Abdominal (+) + obese,   Peds  Hematology negative hematology ROS (+)   Anesthesia Other Findings    Reproductive/Obstetrics                             Anesthesia Physical Anesthesia Plan  ASA: II  Anesthesia Plan: General   Post-op Pain Management:    Induction: Intravenous  PONV Risk Score and Plan: 1 and Propofol infusion  Airway Management Planned: Natural Airway  Additional Equipment:   Intra-op Plan:   Post-operative Plan:   Informed Consent: I have reviewed the patients History and Physical, chart, labs and discussed the procedure including the risks, benefits and alternatives for the proposed anesthesia with the patient or authorized representative who has indicated his/her understanding and acceptance.     Dental advisory given  Plan Discussed with: CRNA and Anesthesiologist  Anesthesia Plan Comments:         Anesthesia Quick  Evaluation

## 2019-06-03 NOTE — Anesthesia Post-op Follow-up Note (Signed)
Anesthesia QCDR form completed.        

## 2019-06-04 ENCOUNTER — Encounter: Payer: Self-pay | Admitting: Gastroenterology

## 2019-06-04 LAB — SURGICAL PATHOLOGY

## 2019-06-05 ENCOUNTER — Encounter: Payer: Self-pay | Admitting: Gastroenterology

## 2019-07-23 ENCOUNTER — Ambulatory Visit: Payer: Managed Care, Other (non HMO) | Admitting: Podiatry

## 2019-08-04 ENCOUNTER — Other Ambulatory Visit: Payer: Self-pay

## 2019-08-04 ENCOUNTER — Ambulatory Visit (INDEPENDENT_AMBULATORY_CARE_PROVIDER_SITE_OTHER): Payer: Managed Care, Other (non HMO)

## 2019-08-04 ENCOUNTER — Telehealth: Payer: Self-pay | Admitting: Podiatry

## 2019-08-04 ENCOUNTER — Encounter: Payer: Self-pay | Admitting: Podiatry

## 2019-08-04 ENCOUNTER — Ambulatory Visit (INDEPENDENT_AMBULATORY_CARE_PROVIDER_SITE_OTHER): Payer: Managed Care, Other (non HMO) | Admitting: Podiatry

## 2019-08-04 ENCOUNTER — Other Ambulatory Visit: Payer: Self-pay | Admitting: Podiatry

## 2019-08-04 DIAGNOSIS — M778 Other enthesopathies, not elsewhere classified: Secondary | ICD-10-CM

## 2019-08-04 DIAGNOSIS — M898X7 Other specified disorders of bone, ankle and foot: Secondary | ICD-10-CM

## 2019-08-04 DIAGNOSIS — Q828 Other specified congenital malformations of skin: Secondary | ICD-10-CM | POA: Diagnosis not present

## 2019-08-04 NOTE — Telephone Encounter (Signed)
Called pt to let him know I would call him tomorrow with more clarification about his surgery as I won't have his papers until then. Explained I could not give him a time of surgery, that he would get a call from the surgical center a day or two prior. Explained to the pt that he could set up a MyChart account to view his records with [place] and any other physician in the [facility] system, request an appointment or Rx refill, or ask questions.   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

## 2019-08-04 NOTE — Patient Instructions (Signed)
Pre-Operative Instructions  Congratulations, you have decided to take an important step towards improving your quality of life.  You can be assured that the doctors and staff at [facility] will be with you every step of the way.  Here are some important things you should know:  1. Plan to be at the surgery center/hospital at least 1 (one) hour prior to your scheduled time, unless otherwise directed by the surgical center/hospital staff.  You must have a responsible adult accompany you, remain during the surgery and drive you home.  Make sure you have directions to the surgical center/hospital to ensure you arrive on time. 2. If you are having surgery at [facility] or [facility], you will need a copy of your medical history and physical form from your family physician within one month prior to the date of surgery. We will give you a form for your primary physician to complete.  3. We make every effort to accommodate the date you request for surgery.  However, there are times where surgery dates or times have to be moved.  We will contact you as soon as possible if a change in schedule is required.   4. No aspirin/ibuprofen for one week before surgery.  If you are on aspirin, any non-steroidal anti-inflammatory medications (Mobic, Aleve, Ibuprofen) should not be taken seven (7) days prior to your surgery.  You make take Tylenol for pain prior to surgery.  5. Medications - If you are taking daily heart and blood pressure medications, seizure, reflux, allergy, asthma, anxiety, pain or diabetes medications, make sure you notify the surgery center/hospital before the day of surgery so they can tell you which medications you should take or avoid the day of surgery. 6. No food or drink after midnight the night before surgery unless directed otherwise by surgical center/hospital staff. 7. No alcoholic beverages 24-hours prior to surgery.  No smoking 24-hours prior or 24-hours after  surgery. 8. Wear loose pants or shorts. They should be loose enough to fit over bandages, boots, and casts. 9. Don't wear slip-on shoes. Sneakers are preferred. 10. Bring your boot with you to the surgery center/hospital.  Also bring crutches or a walker if your physician has prescribed it for you.  If you do not have this equipment, it will be provided for you after surgery. 11. If you have not been contacted by the surgery center/hospital by the day before your surgery, call to confirm the date and time of your surgery. 12. Leave-time from work may vary depending on the type of surgery you have.  Appropriate arrangements should be made prior to surgery with your employer. 13. Prescriptions will be provided immediately following surgery by your doctor.  Fill these as soon as possible after surgery and take the medication as directed. Pain medications will not be refilled on weekends and must be approved by the doctor. 14. Remove nail polish on the operative foot and avoid getting pedicures prior to surgery. 15. Wash the night before surgery.  The night before surgery wash the foot and leg well with water and the antibacterial soap provided. Be sure to pay special attention to beneath the toenails and in between the toes.  Wash for at least three (3) minutes. Rinse thoroughly with water and dry well with a towel.  Perform this wash unless told not to do so by your physician.  Enclosed: 1 Ice pack (please put in freezer the night before surgery)   1 Hibiclens skin cleaner     Pre-op instructions  If you have any questions regarding the instructions, please do not hesitate to call our office.  [place]: [address], [place], [place] [address] -- [phone]  [place]: [address] [place], [place] [address] -- [phone]  [place]: [address]  [place], [place] [address] -- [phone]   Website: [email or web address] 

## 2019-08-04 NOTE — Progress Notes (Signed)
  Subjective:  Patient ID: Caleb Lopez, male    DOB: [birth year],  MRN: [id number] HPI Chief Complaint  Patient presents with  . Toe Pain    4th and 5th toes bilateral - callused areas, treated in 2017, recommended surgery and he wants to proceed with that   . New Patient (Initial Visit)    Est pt 01/2016    58 y.o. male presents with the above complaint.   ROS: Denies fever chills nausea vomiting muscle aches pains calf pain back pain chest pain shortness of breath.  No past medical history on file. Past Surgical History:  Procedure Laterality Date  . COLONOSCOPY WITH PROPOFOL N/A 06/03/2019   Procedure: COLONOSCOPY WITH PROPOFOL;  Surgeon: Lucilla Lame, MD;  Location: [facility] ENDOSCOPY;  Service: Endoscopy;  Laterality: N/A;  . THORACENTESIS  1980'S   x's 2 probably infection  . TONSILLECTOMY AND ADENOIDECTOMY  1960'S   No current outpatient medications on file.  No Known Allergies Review of Systems Objective:  There were no vitals filed for this visit.  General: Well developed, nourished, in no acute distress, alert and oriented x3   Dermatological: Skin is warm, dry and supple bilateral. Nails x 10 are well maintained; remaining integument appears unremarkable at this time. There are no open sores, no preulcerative lesions, no rash or signs of infection present.  Vascular: Dorsalis Pedis artery and Posterior Tibial artery pedal pulses are 2/4 bilateral with immedate capillary fill time. Pedal hair growth present. No varicosities and no lower extremity edema present bilateral.   Neruologic: Grossly intact via light touch bilateral. Vibratory intact via tuning fork bilateral. Protective threshold with Semmes Wienstein monofilament intact to all pedal sites bilateral. Patellar and Achilles deep tendon reflexes 2+ bilateral. No Babinski or clonus noted bilateral.   Musculoskeletal: No gross boney pedal deformities bilateral. No pain, crepitus, or limitation noted with foot and  ankle range of motion bilateral. Muscular strength 5/5 in all groups tested bilateral.  Adductovarus rotated hammertoe deformities fifth bilateral prominent exostosis medial aspect of the distal phalanx fifth toe as well as hypertrophic lateral condyle to the proximal phalanx fourth toe bilaterally.  Gait: Unassisted, Nonantalgic.    Radiographs:  Radiographs taken today demonstrate adductovarus rotated hammertoe deformities fifth bilateral.  The no acute findings.  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  Plan: Discussed etiology pathology and surgical therapy at this point time consented him for derotational arthroplasty exostectomy medial aspect of the fifth toe and an exostectomy condylectomy fourth digit bilateral.  We discussed the pros and cons of the surgery discussed etiology pathology and surgical therapies discussed possible side effects and complications he understands and is amenable to receive information regarding the surgery center anesthesia and instructions for the morning of surgery.  Also received to Darco shoes and I will follow-up with him in a month for surgery.     Max T. [place], [place]

## 2019-08-04 NOTE — Telephone Encounter (Signed)
I saw Dr. Milinda Pointer today and was scheduled for surgery on 12/18 I assume in the morning. If you could call me back to confirm that and I also have a question about this MyChart and the code that also expires on 12/18.

## 2019-08-05 ENCOUNTER — Telehealth: Payer: Self-pay | Admitting: Podiatry

## 2019-08-05 NOTE — Telephone Encounter (Signed)
DOS: 08/29/2019  SURGICAL PROCEDURES: Hammertoe Repair 5th Bilateral(28285) and Exostectomy 4th and 5th Bilateral(28108)  [employer] Effective Date: 09/11/2018  Deductible is $3,000 with $1,849 met and $1,151 remaining. Out of Pocket is $7,350 with $1,1926.06 met and $5,428.94 remaining. CoInsurance is 70% / 30% after deductible is met.  No prior authorization or referrals are required per [facility]. Ref# [id number].

## 2019-08-05 NOTE — Telephone Encounter (Signed)
Called pt and confirmed his surgery date of 12/18 with him. Pt states he uses [facility] at corner of [address] and [address] at [employer] (states it is the one at [facility]). Pt confirmed the phone number and address of the surgical center and asked about his time. I told him I could not give him a time that someone from the surgery center would call a day or two prior to let him know what time to arrive. Pt stated he did not get the Hibiclens in his bag. I instructed him to go to the [place] office to pick that up so he can wash his foot the night before surgery. Pt wanted to know if it was okay to wear clean dry socks the morning of after washing his foot the night before and also wanted to know what he should wear. I told the pt he could wear clean socks and I would recommend wearing a pair of sweat pants or loose fitting exercise pants. Pt wanted to know if he would get tested for the Coronavirus before surgery. I told him the surgical center is only testing if you are being put to sleep but told him he could call and confirm with them. Told pt to call me with any other questions or concerns.

## 2019-08-28 ENCOUNTER — Other Ambulatory Visit: Payer: Self-pay | Admitting: Podiatry

## 2019-08-28 MED ORDER — ONDANSETRON HCL 4 MG PO TABS: 4.0000 mg | ORAL_TABLET | Freq: Three times a day (TID) | ORAL | 0 refills | Status: DC | PRN

## 2019-08-28 MED ORDER — CEPHALEXIN 500 MG PO CAPS: 500.0000 mg | ORAL_CAPSULE | Freq: Three times a day (TID) | ORAL | 0 refills | Status: DC

## 2019-08-28 MED ORDER — OXYCODONE-ACETAMINOPHEN 10-325 MG PO TABS: 1.0000 | ORAL_TABLET | Freq: Three times a day (TID) | ORAL | 0 refills | Status: AC | PRN

## 2019-08-29 ENCOUNTER — Encounter: Payer: Self-pay | Admitting: Podiatry

## 2019-08-29 DIAGNOSIS — M25774 Osteophyte, right foot: Secondary | ICD-10-CM | POA: Diagnosis not present

## 2019-08-29 DIAGNOSIS — M2042 Other hammer toe(s) (acquired), left foot: Secondary | ICD-10-CM

## 2019-08-29 DIAGNOSIS — M25775 Osteophyte, left foot: Secondary | ICD-10-CM | POA: Diagnosis not present

## 2019-08-29 DIAGNOSIS — M2041 Other hammer toe(s) (acquired), right foot: Secondary | ICD-10-CM

## 2019-09-03 ENCOUNTER — Ambulatory Visit (INDEPENDENT_AMBULATORY_CARE_PROVIDER_SITE_OTHER): Payer: Managed Care, Other (non HMO) | Admitting: Podiatry

## 2019-09-03 ENCOUNTER — Ambulatory Visit (INDEPENDENT_AMBULATORY_CARE_PROVIDER_SITE_OTHER): Payer: Managed Care, Other (non HMO)

## 2019-09-03 ENCOUNTER — Other Ambulatory Visit: Payer: Self-pay

## 2019-09-03 ENCOUNTER — Encounter: Payer: Self-pay | Admitting: Podiatry

## 2019-09-03 DIAGNOSIS — M898X7 Other specified disorders of bone, ankle and foot: Secondary | ICD-10-CM

## 2019-09-03 DIAGNOSIS — M2042 Other hammer toe(s) (acquired), left foot: Secondary | ICD-10-CM

## 2019-09-03 DIAGNOSIS — M2041 Other hammer toe(s) (acquired), right foot: Secondary | ICD-10-CM

## 2019-09-03 NOTE — Progress Notes (Signed)
He presents today date of surgery 08/29/2019 status post derotational arthroplasty fifth bilateral exostectomy fifth bilateral and exostectomy fourth bilaterally.  He states that he has been doing very well with no problems whatsoever.  Objective: Vital signs are stable he is alert and oriented x3.  Pulses are palpable.  Dry sterile dressing intact was removed demonstrates well-healing well-positioned toes fourth and fifth bilaterally.  Radiographs confirm this.  Assessment: Well-healing surgical toes bilaterally.  Plan: Redressed today dressed a compressive dressing put it back in his Darco shoes follow-up with him in a week for suture removal hopefully.

## 2019-09-10 ENCOUNTER — Encounter: Payer: Self-pay | Admitting: Podiatry

## 2019-09-10 ENCOUNTER — Ambulatory Visit (INDEPENDENT_AMBULATORY_CARE_PROVIDER_SITE_OTHER): Payer: Managed Care, Other (non HMO) | Admitting: Podiatry

## 2019-09-10 ENCOUNTER — Other Ambulatory Visit: Payer: Self-pay

## 2019-09-10 DIAGNOSIS — M2042 Other hammer toe(s) (acquired), left foot: Secondary | ICD-10-CM

## 2019-09-10 DIAGNOSIS — M2041 Other hammer toe(s) (acquired), right foot: Secondary | ICD-10-CM

## 2019-09-10 NOTE — Progress Notes (Signed)
He presents today 2 weeks status post hammertoe repair fifth bilateral and condylectomy fourth toe PIPJ bilaterally.  He states that he is doing fine no problems.  Presents today with his Darco shoes.  Objective: Vital signs are stable he is alert and oriented x3 dry sterile dressing was removed demonstrates sutures intact margins well coapted however there is some mild dehiscence distal fifth right and distal fourth right.  At this point it is decided to leave all of the sutures in.  Assessment: Well-healing surgical toes bilateral.  Plan: I dressed the toes individually today with Coban follow-up with him in 1 week for suture removal.

## 2019-09-17 ENCOUNTER — Other Ambulatory Visit: Payer: Self-pay

## 2019-09-17 ENCOUNTER — Encounter: Payer: Self-pay | Admitting: Podiatry

## 2019-09-17 ENCOUNTER — Ambulatory Visit (INDEPENDENT_AMBULATORY_CARE_PROVIDER_SITE_OTHER): Payer: Managed Care, Other (non HMO) | Admitting: Podiatry

## 2019-09-17 DIAGNOSIS — M2042 Other hammer toe(s) (acquired), left foot: Secondary | ICD-10-CM

## 2019-09-17 DIAGNOSIS — M2041 Other hammer toe(s) (acquired), right foot: Secondary | ICD-10-CM

## 2019-09-17 DIAGNOSIS — Z9889 Other specified postprocedural states: Secondary | ICD-10-CM

## 2019-09-17 DIAGNOSIS — M898X7 Other specified disorders of bone, ankle and foot: Secondary | ICD-10-CM

## 2019-09-17 NOTE — Progress Notes (Signed)
He presents today date of surgery 08/29/2019 status post hammertoe repair fifth bilateral exostectomy fifth and fourth bilateral.  Denies fever chills nausea vomiting muscle aches pains calf pain back pain chest pain shortness of breath.  Objective: Dry sterile dressing was intact was removed demonstrates sutures are intact margins well coapted sutures were removed today margins remain well coapted.  Toes are slightly swollen.  Assessment: Well-healing surgical toes.  Plan: Went ahead and wrap the toes today with Covan he will continue the use of the Darco shoes and I will follow-up with him in 2 weeks at which time we will go into regular shoe gear.

## 2019-09-24 ENCOUNTER — Other Ambulatory Visit: Payer: Managed Care, Other (non HMO)

## 2019-09-29 ENCOUNTER — Ambulatory Visit (INDEPENDENT_AMBULATORY_CARE_PROVIDER_SITE_OTHER): Payer: Managed Care, Other (non HMO)

## 2019-09-29 ENCOUNTER — Other Ambulatory Visit: Payer: Self-pay

## 2019-09-29 ENCOUNTER — Ambulatory Visit (INDEPENDENT_AMBULATORY_CARE_PROVIDER_SITE_OTHER): Payer: Managed Care, Other (non HMO) | Admitting: Podiatry

## 2019-09-29 ENCOUNTER — Encounter: Payer: Self-pay | Admitting: Podiatry

## 2019-09-29 DIAGNOSIS — Z9889 Other specified postprocedural states: Secondary | ICD-10-CM

## 2019-09-29 DIAGNOSIS — M2042 Other hammer toe(s) (acquired), left foot: Secondary | ICD-10-CM | POA: Diagnosis not present

## 2019-09-29 DIAGNOSIS — M2041 Other hammer toe(s) (acquired), right foot: Secondary | ICD-10-CM

## 2019-09-29 NOTE — Progress Notes (Signed)
He presents today date of surgery 08/29/2019 status post hammertoe repair fourth and fifth bilaterally.  He states he has not been washing the toes yet after I told him last time to wash the toe she states he still wearing his Darco shoes after I told him to try to get into regular shoe gear.  He states he just finished wrapping the toes on a regular basis.  States that they are feeling pretty good.  Objective: Vital signs stable he is alert and oriented x3.  There is mild edema no erythema cellulitis drainage or odor he has dry xerotic skin around the toes from not having washed them.  But all in all the toes are doing much better the rectus layer in good position and for the most part nontender.  Assessment: Well-healing surgical foot bilateral.  Plan: Encouraged him to start washing the foot and get him into regular shoes and use lotion.

## 2019-10-13 ENCOUNTER — Encounter: Payer: Self-pay | Admitting: Podiatry

## 2019-10-13 ENCOUNTER — Ambulatory Visit (INDEPENDENT_AMBULATORY_CARE_PROVIDER_SITE_OTHER): Payer: Self-pay | Admitting: Podiatry

## 2019-10-13 ENCOUNTER — Other Ambulatory Visit: Payer: Self-pay

## 2019-10-13 DIAGNOSIS — Z9889 Other specified postprocedural states: Secondary | ICD-10-CM

## 2019-10-13 DIAGNOSIS — M898X7 Other specified disorders of bone, ankle and foot: Secondary | ICD-10-CM

## 2019-10-13 DIAGNOSIS — M2041 Other hammer toe(s) (acquired), right foot: Secondary | ICD-10-CM

## 2019-10-13 DIAGNOSIS — M2042 Other hammer toe(s) (acquired), left foot: Secondary | ICD-10-CM

## 2019-10-13 NOTE — Progress Notes (Signed)
Kaileb presents today date of surgery August 29, 2019 status post hammertoe repair fifth digits bilaterally exostectomy fifth digit bilateral and fourth digits bilateral.  He states they are really doing well he states he has been soaking his feet and all of the scabs and scars are starting to come off and look much better.  Feels that when he stretches his feet he feels the put the toes pull and that concerns him.  He also states that they are still swollen and feels funny in shoes.  Objective: Vital signs are stable he is alert and oriented x3.  Pulses are palpable.  All of the scabs have come off the feet at this point and is doing very well.  Still has some mild edema overlying the fifth and fourth toes bilaterally but this is coming down.  He has some mild tenderness on palpation of the areas but this is normal.  Assessment: Unable to say that he is approximately 75% healed he does have some at home physical therapy that he needs to do to get the swelling down and get back to his regular shoe gear.  Plan: I am going to encourage him to perform massage therapy to the toes 2-3 times a day after warm water and Epson salt soaks also going encouraged him to get back in his regular shoe gear so that by the first or middle of next month he will be able to get back to work.  Until then wearing his regular shoes as long as he can daily and to be on his feet is much as he can and try to do more each day so that he can mimic his workload.  I will follow-up with him with questions or concerns otherwise I will see him in 4 to 5 weeks before releasing him to work.

## 2019-11-19 ENCOUNTER — Encounter: Payer: Self-pay | Admitting: *Deleted

## 2019-11-19 ENCOUNTER — Encounter: Payer: Self-pay | Admitting: Podiatry

## 2019-11-19 ENCOUNTER — Other Ambulatory Visit: Payer: Self-pay

## 2019-11-19 ENCOUNTER — Ambulatory Visit (INDEPENDENT_AMBULATORY_CARE_PROVIDER_SITE_OTHER): Payer: Managed Care, Other (non HMO) | Admitting: Podiatry

## 2019-11-19 DIAGNOSIS — M2042 Other hammer toe(s) (acquired), left foot: Secondary | ICD-10-CM

## 2019-11-19 DIAGNOSIS — Z9889 Other specified postprocedural states: Secondary | ICD-10-CM

## 2019-11-19 DIAGNOSIS — M898X7 Other specified disorders of bone, ankle and foot: Secondary | ICD-10-CM

## 2019-11-19 DIAGNOSIS — M2041 Other hammer toe(s) (acquired), right foot: Secondary | ICD-10-CM

## 2019-11-19 NOTE — Progress Notes (Signed)
He presents today date of surgery 08/29/2019 status post hammertoe repair fifth bilateral fourth bilateral states that I am doing good he states that he went on his first long walk and he did really well did really hurt he says I wear the shoes all the time and there have not been hurting me.  They are the same cannot wear to work.  Objective: Vital signs are stable he is alert and oriented x3.  There is no erythema edema cellulitis drainage or odor incision sites to toes 4 and 5 appear to be healing very nicely.  Assessment: Well-healing surgical foot bilateral.  Plan: Allow him to get back to work Friday and I will follow-up with him on an as-needed basis.

## 2020-06-21 ENCOUNTER — Ambulatory Visit (INDEPENDENT_AMBULATORY_CARE_PROVIDER_SITE_OTHER): Payer: Managed Care, Other (non HMO) | Admitting: Family Medicine

## 2020-06-21 ENCOUNTER — Other Ambulatory Visit: Payer: Self-pay

## 2020-06-21 ENCOUNTER — Encounter: Payer: Self-pay | Admitting: Family Medicine

## 2020-06-21 VITALS — BP 123/87 | HR 83 | Temp 98.4°F | Ht 72.0 in | Wt 269.0 lb

## 2020-06-21 DIAGNOSIS — E7800 Pure hypercholesterolemia, unspecified: Secondary | ICD-10-CM

## 2020-06-21 DIAGNOSIS — Z0000 Encounter for general adult medical examination without abnormal findings: Secondary | ICD-10-CM | POA: Diagnosis not present

## 2020-06-21 DIAGNOSIS — Z125 Encounter for screening for malignant neoplasm of prostate: Secondary | ICD-10-CM | POA: Diagnosis not present

## 2020-06-21 DIAGNOSIS — Z6836 Body mass index (BMI) 36.0-36.9, adult: Secondary | ICD-10-CM | POA: Diagnosis not present

## 2020-06-21 DIAGNOSIS — Z23 Encounter for immunization: Secondary | ICD-10-CM | POA: Diagnosis not present

## 2020-06-21 DIAGNOSIS — I2510 Atherosclerotic heart disease of native coronary artery without angina pectoris: Secondary | ICD-10-CM

## 2020-06-21 NOTE — Progress Notes (Signed)
Complete physical exam   Patient: Caleb Lopez   DOB: 1961/04/15   59 y.o. Male  MRN: [id number] Visit Date: 06/21/2020  Today's healthcare provider: Vernie Murders, PA   No chief complaint on file.  Subjective    Caleb Lopez is a 59 y.o. male who presents today for a complete physical exam.  He reports consuming a general diet. He generally feels well. He reports sleeping well. He does not have additional problems to discuss today.   Patient Active Problem List   Diagnosis Date Noted   Encounter for screening colonoscopy    Benign neoplasm of cecum    Polyp of sigmoid colon    Obesity 06/21/2015   Coronary artery disease 06/21/2015   Adult BMI 36.0-36.9 kg/sq m 06/18/2015   Adiposity 06/18/2015   Lump in scrotum 06/18/2015   Acquired deformity of head 06/18/2015   Strict vegetarian diet 06/18/2015   Pericarditis 08/12/2008   Apnea, sleep 08/12/2008   Allergic rhinitis 02/13/2008   LBP (low back pain) 02/13/2008   No past medical history on file.   Past Surgical History:  Procedure Laterality Date   COLONOSCOPY WITH PROPOFOL N/A 06/03/2019   Procedure: COLONOSCOPY WITH PROPOFOL;  Surgeon: Lucilla Lame, MD;  Location: [facility] ENDOSCOPY;  Service: Endoscopy;  Laterality: N/A;   THORACENTESIS  1980'S   x's 2 probably infection   TONSILLECTOMY AND ADENOIDECTOMY  1960'S   Social History   Socioeconomic History   Marital status: Married    Spouse name: Not on file   Number of children: Not on file   Years of education: Not on file   Highest education level: Not on file  Occupational History   Not on file  Tobacco Use   Smoking status: Former Smoker    Quit date: 09/11/1988    Years since quitting: 31.7   Smokeless tobacco: Never Used  Scientific laboratory technician Use: Never used  Substance and Sexual Activity   Alcohol use: Yes    Comment: occasional   Drug use: Never   Sexual activity: Not on file  Other Topics Concern   Not on file  Social History  Narrative   Not on file   Social Determinants of Health   Financial Resource Strain:    Difficulty of Paying Living Expenses: Not on file  Food Insecurity:    Worried About Charity fundraiser in the Last Year: Not on file   Spreckels in the Last Year: Not on file  Transportation Needs:    Lack of Transportation (Medical): Not on file   Lack of Transportation (Non-Medical): Not on file  Physical Activity:    Days of Exercise per Week: Not on file   Minutes of Exercise per Session: Not on file  Stress:    Feeling of Stress : Not on file  Social Connections:    Frequency of Communication with Friends and Family: Not on file   Frequency of Social Gatherings with Friends and Family: Not on file   Attends Religious Services: Not on file   Active Member of Clubs or Organizations: Not on file   Attends Archivist Meetings: Not on file   Marital Status: Not on file  Intimate Partner Violence:    Fear of Current or Ex-Partner: Not on file   Emotionally Abused: Not on file   Physically Abused: Not on file   Sexually Abused: Not on file   Family Status  Relation Name Status  Mother  Alive   Father  Alive   Sister  Alive       pituitary adenoma   Family History  Problem Relation Age of Onset   Diverticulitis Mother    Arthritis Mother    Glaucoma Mother    Diabetes Father    No Known Allergies   Patient Care Team: Dyneisha Murchison, Vickki Muff, [place] as PCP - General (Family Medicine)   Medications: No outpatient medications prior to visit.   No facility-administered medications prior to visit.    Review of Systems  Constitutional: Negative.   HENT: Negative.   Eyes: Negative.   Respiratory: Negative.   Cardiovascular: Negative.   Gastrointestinal: Negative.   Endocrine: Negative.   Genitourinary: Negative.   Musculoskeletal: Negative.   Skin: Negative.   Allergic/Immunologic: Negative.   Neurological: Negative.   Hematological: Negative.    Psychiatric/Behavioral: Negative.       Objective    BP 123/87 (BP Location: Right Arm, Patient Position: Sitting, Cuff Size: Normal)   Pulse 83   Temp 98.4 F (36.9 C) (Oral)   Ht 6' (1.829 m)   Wt 269 lb (122 kg)   SpO2 96%   BMI 36.48 kg/m  BP Readings from Last 3 Encounters:  06/21/20 123/87  06/03/19 107/80  02/18/19 122/80   Wt Readings from Last 3 Encounters:  06/21/20 269 lb (122 kg)  06/03/19 260 lb (117.9 kg)  02/18/19 250 lb (113.4 kg)   Physical Exam Constitutional:      Appearance: Normal appearance. He is normal weight.  HENT:     Head: Normocephalic and atraumatic.     Right Ear: Tympanic membrane, ear canal and external ear normal.     Left Ear: Tympanic membrane, ear canal and external ear normal.     Nose: Nose normal.     Mouth/Throat:     Mouth: Mucous membranes are moist.     Pharynx: Oropharynx is clear.  Eyes:     Extraocular Movements: Extraocular movements intact.     Conjunctiva/sclera: Conjunctivae normal.     Pupils: Pupils are equal, round, and reactive to light.  Cardiovascular:     Rate and Rhythm: Normal rate and regular rhythm.     Pulses: Normal pulses.     Heart sounds: Normal heart sounds.  Pulmonary:     Effort: Pulmonary effort is normal.     Breath sounds: Normal breath sounds.  Abdominal:     General: Abdomen is flat. Bowel sounds are normal.     Palpations: Abdomen is soft.  Genitourinary:    Penis: Normal.      Testes: Normal.     Prostate: Normal.     Rectum: Normal. Guaiac result negative.  Musculoskeletal:        General: Normal range of motion.     Cervical back: Normal range of motion and neck supple.  Skin:    General: Skin is warm and dry.  Neurological:     General: No focal deficit present.     Mental Status: He is alert and oriented to person, place, and time. Mental status is at baseline.  Psychiatric:        Mood and Affect: Mood normal.        Behavior: Behavior normal.        Thought Content:  Thought content normal.        Judgment: Judgment normal.     Last depression screening scores PHQ 2/9 Scores 06/21/2020 02/13/2019 12/27/2017  PHQ - 2 Score  0 0 0  PHQ- 9 Score - - 0   Last fall risk screening Fall Risk  06/21/2020  Falls in the past year? 0  Injury with Fall? 0   Last Audit-C alcohol use screening Alcohol Use Disorder Test (AUDIT) 06/21/2020  1. How often do you have a drink containing alcohol? 0  2. How many drinks containing alcohol do you have on a typical day when you are drinking? -  3. How often do you have six or more drinks on one occasion? -  AUDIT-C Score -  Alcohol Brief Interventions/Follow-up AUDIT Score <7 follow-up not indicated   A score of 3 or more in women, and 4 or more in men indicates increased risk for alcohol abuse, EXCEPT if all of the points are from question 1   No results found for any visits on 06/21/20.  Assessment & Plan    Routine Health Maintenance and Physical Exam  Exercise Activities and Dietary recommendations Goals   Delay in weight loss program due to pandemic restrictions of 2020-2021. Encouraged to exercise 30-40 minute 3-4 days a week and restrict simple carbohydrates in a low fat diet     Immunization History  Administered Date(s) Administered   Influenza,inj,Quad PF,6+ Mos 07/30/2014, 07/04/2017, 06/06/2018, 07/11/2019   Tdap 04/29/2012   Zoster 04/29/2012    Health Maintenance  Topic Date Due   COVID-19 Vaccine (1) Never done   INFLUENZA VACCINE  04/11/2020   TETANUS/TDAP  04/29/2022   COLONOSCOPY  06/02/2024   Hepatitis C Screening  Completed   HIV Screening  Completed    Discussed health benefits of physical activity, and encouraged him to engage in regular exercise appropriate for his age and condition.  1. Annual physical exam General health stable. Still needs to lose weight. Given anticipatory counseling and will get follow up labs. Declines COVID immunizations today. - CBC with  Differential/Platelet - Comprehensive metabolic panel - Lipid panel - PSA - TSH  2. Adult BMI 36.0-36.9 kg/sq m Weight up to 269 now. Counseled regarding low fat diet, exercise and weight loss. Recheck labs to rule out metabolic disorder. - CBC with Differential/Platelet - Comprehensive metabolic panel - Lipid panel - TSH - Hemoglobin A1c  3. Pure hypercholesterolemia LDL was 150 with HDL 38 and total cholesterol 210 in 2020. Continue low fat diet and recheck labs. - CBC with Differential/Platelet - Comprehensive metabolic panel - Lipid panel - TSH  4. Prostate cancer screening - Comprehensive metabolic panel - PSA  5. Needs flu shot -Flu Vaccine QUAD 6+ mos PF IM (Fluarix Quad PF)  6. Coronary artery disease involving native coronary artery of native heart without angina pectoris Cardiac cath in 2009 showed 60% stenosis of LAD per Dr. Mauri Pole (cardiologist at [facility]). Will recheck labs. No chest pains or palpitations. EKG shows RBBB. Diagnosed with pericarditis on 08-12-08.   No follow-ups on file.     Andres Shad, PA, have reviewed all documentation for this visit. The documentation on 06/21/20 for the exam, diagnosis, procedures, and orders are all accurate and complete.    Vernie Murders, [place] [phone] (phone) [phone] (fax)  [place]

## 2020-06-22 ENCOUNTER — Other Ambulatory Visit: Payer: Self-pay

## 2020-06-22 LAB — CBC WITH DIFFERENTIAL/PLATELET
Basophils Absolute: 0 10*3/uL (ref 0.0–0.2)
Basos: 0 %
EOS (ABSOLUTE): 0.1 10*3/uL (ref 0.0–0.4)
Eos: 1 %
Hematocrit: 43.4 % (ref 37.5–51.0)
Hemoglobin: 14.9 g/dL (ref 13.0–17.7)
Immature Grans (Abs): 0 10*3/uL (ref 0.0–0.1)
Immature Granulocytes: 1 %
Lymphocytes Absolute: 1.3 10*3/uL (ref 0.7–3.1)
Lymphs: 21 %
MCH: 31 pg (ref 26.6–33.0)
MCHC: 34.3 g/dL (ref 31.5–35.7)
MCV: 90 fL (ref 79–97)
Monocytes Absolute: 0.6 10*3/uL (ref 0.1–0.9)
Monocytes: 10 %
Neutrophils Absolute: 4.4 10*3/uL (ref 1.4–7.0)
Neutrophils: 67 %
Platelets: 259 10*3/uL (ref 150–450)
RBC: 4.8 x10E6/uL (ref 4.14–5.80)
RDW: 13.3 % (ref 11.6–15.4)
WBC: 6.5 10*3/uL (ref 3.4–10.8)

## 2020-06-22 LAB — LIPID PANEL
Chol/HDL Ratio: 5.8 ratio — ABNORMAL HIGH (ref 0.0–5.0)
Cholesterol, Total: 190 mg/dL (ref 100–199)
HDL: 33 mg/dL — ABNORMAL LOW (ref >39)
LDL Chol Calc (NIH): 130 mg/dL — ABNORMAL HIGH (ref 0–99)
Triglycerides: 147 mg/dL (ref 0–149)
VLDL Cholesterol Cal: 27 mg/dL (ref 5–40)

## 2020-06-22 LAB — COMPREHENSIVE METABOLIC PANEL
ALT: 23 IU/L (ref 0–44)
AST: 16 IU/L (ref 0–40)
Albumin/Globulin Ratio: 1.9 (ref 1.2–2.2)
Albumin: 4.3 g/dL (ref 3.8–4.8)
Alkaline Phosphatase: 109 IU/L (ref 44–121)
BUN/Creatinine Ratio: 17 (ref 10–24)
BUN: 14 mg/dL (ref 8–27)
Bilirubin Total: 0.5 mg/dL (ref 0.0–1.2)
CO2: 21 mmol/L (ref 20–29)
Calcium: 9.4 mg/dL (ref 8.6–10.2)
Chloride: 107 mmol/L — ABNORMAL HIGH (ref 96–106)
Creatinine, Ser: 0.83 mg/dL (ref 0.76–1.27)
GFR calc Af Amer: 110 mL/min/{1.73_m2} (ref >59)
GFR calc non Af Amer: 95 mL/min/{1.73_m2} (ref >59)
Globulin, Total: 2.3 g/dL (ref 1.5–4.5)
Glucose: 85 mg/dL (ref 65–99)
Potassium: 4.1 mmol/L (ref 3.5–5.2)
Sodium: 140 mmol/L (ref 134–144)
Total Protein: 6.6 g/dL (ref 6.0–8.5)

## 2020-06-22 LAB — TSH: TSH: 1.85 u[IU]/mL (ref 0.450–4.500)

## 2020-06-22 LAB — HEMOGLOBIN A1C
Est. average glucose Bld gHb Est-mCnc: 111 mg/dL
Hgb A1c MFr Bld: 5.5 % (ref 4.8–5.6)

## 2020-06-22 LAB — PSA: Prostate Specific Ag, Serum: 1.1 ng/mL (ref 0.0–4.0)

## 2020-06-22 MED ORDER — ATORVASTATIN CALCIUM 40 MG PO TABS: 40.0000 mg | ORAL_TABLET | Freq: Every day | ORAL | 3 refills | Status: DC

## 2020-09-23 ENCOUNTER — Ambulatory Visit: Payer: Managed Care, Other (non HMO) | Admitting: Family Medicine

## 2020-09-23 ENCOUNTER — Encounter: Payer: Self-pay | Admitting: Family Medicine

## 2020-09-23 ENCOUNTER — Other Ambulatory Visit: Payer: Self-pay

## 2020-09-23 VITALS — BP 112/72 | HR 78 | Temp 98.0°F | Wt 279.8 lb

## 2020-09-23 DIAGNOSIS — E7800 Pure hypercholesterolemia, unspecified: Secondary | ICD-10-CM

## 2020-09-23 NOTE — Progress Notes (Signed)
Established patient visit   Patient: Caleb Lopez   DOB: July 09, 1961   60 y.o. Male  MRN: [id number] Visit Date: 09/23/2020  Today's healthcare provider: Vernie Murders, PA-C   No chief complaint on file.  Subjective    HPI  Lipid/Cholesterol, Follow-up  Last lipid panel Other pertinent labs  Lab Results  Component Value Date   CHOL 190 06/21/2020   HDL 33 (L) 06/21/2020   LDLCALC 130 (H) 06/21/2020   TRIG 147 06/21/2020   CHOLHDL 5.8 (H) 06/21/2020   Lab Results  Component Value Date   ALT 23 06/21/2020   AST 16 06/21/2020   PLT 259 06/21/2020   TSH 1.850 06/21/2020     He was last seen for this 3 months ago.  Management since that visit includes recommended Krill Oil 1000mg  and Atorvastatin 40mg .  He reports excellent compliance with treatment. He is not having side effects.   Symptoms: No chest pain No chest pressure/discomfort  No dyspnea No lower extremity edema  No numbness or tingling of extremity No orthopnea  No palpitations No paroxysmal nocturnal dyspnea  No speech difficulty No syncope   Current diet: vegetarian Current exercise: no regular exercise  The 10-year ASCVD risk score Mikey Bussing DC Brooke Bonito., et al., 2013) is: 11.1%  ---------------------------------------------------------------------------------------------------  Patient Active Problem List   Diagnosis Date Noted  . Encounter for screening colonoscopy   . Benign neoplasm of cecum   . Polyp of sigmoid colon   . Obesity 06/21/2015  . Coronary artery disease 06/21/2015  . Adult BMI 36.0-36.9 kg/sq m 06/18/2015  . Adiposity 06/18/2015  . Lump in scrotum 06/18/2015  . Acquired deformity of head 06/18/2015  . Strict vegetarian diet 06/18/2015  . Pericarditis 08/12/2008  . Apnea, sleep 08/12/2008  . Allergic rhinitis 02/13/2008  . LBP (low back pain) 02/13/2008   No past medical history on file.   No Known Allergies    Past Surgical History:  Procedure Laterality Date  .  COLONOSCOPY WITH PROPOFOL N/A 06/03/2019   Procedure: COLONOSCOPY WITH PROPOFOL;  Surgeon: Lucilla Lame, MD;  Location: [facility] ENDOSCOPY;  Service: Endoscopy;  Laterality: N/A;  . THORACENTESIS  1980'S   x's 2 probably infection  . TONSILLECTOMY AND ADENOIDECTOMY  1960'S   Family History  Problem Relation Age of Onset  . Diverticulitis Mother   . Arthritis Mother   . Glaucoma Mother   . Diabetes Father    Social History   Tobacco Use  . Smoking status: Former Smoker    Quit date: 09/11/1988    Years since quitting: 32.0  . Smokeless tobacco: Never Used  Vaping Use  . Vaping Use: Never used  Substance Use Topics  . Alcohol use: Yes    Comment: occasional  . Drug use: Never    Medications: Outpatient Medications Prior to Visit  Medication Sig  . atorvastatin (LIPITOR) 40 MG tablet Take 1 tablet (40 mg total) by mouth daily.   No facility-administered medications prior to visit.    Review of Systems  Constitutional: Negative.   HENT: Negative.   Respiratory: Negative.   Cardiovascular: Negative.   Gastrointestinal: Negative.   Genitourinary: Negative.       Objective    There were no vitals taken for this visit.   Physical Exam Constitutional:      General: He is not in acute distress.    Appearance: He is well-developed and well-nourished.  HENT:     Head: Normocephalic and atraumatic.  Right Ear: Hearing normal.     Left Ear: Hearing normal.     Nose: Nose normal.  Eyes:     General: Lids are normal. No scleral icterus.       Right eye: No discharge.        Left eye: No discharge.     Conjunctiva/sclera: Conjunctivae normal.  Pulmonary:     Effort: Pulmonary effort is normal. No respiratory distress.  Musculoskeletal:        General: Normal range of motion.  Skin:    General: Skin is intact.     Findings: No lesion or rash.  Neurological:     Mental Status: He is alert and oriented to person, place, and time.  Psychiatric:        Mood and  Affect: Mood and affect normal.        Speech: Speech normal.        Behavior: Behavior normal.        Thought Content: Thought content normal.      No results found for any visits on 09/23/20.  Assessment & Plan     1. Pure hypercholesterolemia Last labs 3 months ago showed low HDl and high LDL. Will continue Atorvastatin 40 mg qd and low fat diet. Recheck labs to assess progress. - Comprehensive metabolic panel - Lipid panel   No follow-ups on file.      I, Hendry Speas, PA-C, have reviewed all documentation for this visit. The documentation on 09/23/20 for the exam, diagnosis, procedures, and orders are all accurate and complete.    Vernie Murders, PA-C  [employer] [phone] (phone) [phone] (fax)  [place]

## 2020-09-24 ENCOUNTER — Telehealth: Payer: Self-pay

## 2020-09-24 LAB — LIPID PANEL
Chol/HDL Ratio: 3.3 ratio (ref 0.0–5.0)
Cholesterol, Total: 135 mg/dL (ref 100–199)
HDL: 41 mg/dL (ref >39)
LDL Chol Calc (NIH): 73 mg/dL (ref 0–99)
Triglycerides: 116 mg/dL (ref 0–149)
VLDL Cholesterol Cal: 21 mg/dL (ref 5–40)

## 2020-09-24 LAB — COMPREHENSIVE METABOLIC PANEL
ALT: 29 IU/L (ref 0–44)
AST: 18 IU/L (ref 0–40)
Albumin/Globulin Ratio: 2.3 — ABNORMAL HIGH (ref 1.2–2.2)
Albumin: 4.5 g/dL (ref 3.8–4.8)
Alkaline Phosphatase: 116 IU/L (ref 44–121)
BUN/Creatinine Ratio: 17 (ref 10–24)
BUN: 14 mg/dL (ref 8–27)
Bilirubin Total: 0.4 mg/dL (ref 0.0–1.2)
CO2: 20 mmol/L (ref 20–29)
Calcium: 9.5 mg/dL (ref 8.6–10.2)
Chloride: 110 mmol/L — ABNORMAL HIGH (ref 96–106)
Creatinine, Ser: 0.82 mg/dL (ref 0.76–1.27)
GFR calc Af Amer: 110 mL/min/{1.73_m2} (ref >59)
GFR calc non Af Amer: 95 mL/min/{1.73_m2} (ref >59)
Globulin, Total: 2 g/dL (ref 1.5–4.5)
Glucose: 90 mg/dL (ref 65–99)
Potassium: 4.4 mmol/L (ref 3.5–5.2)
Sodium: 143 mmol/L (ref 134–144)
Total Protein: 6.5 g/dL (ref 6.0–8.5)

## 2020-09-24 NOTE — Telephone Encounter (Signed)
LMTCB 09/24/2020.  PEC please advise pt of lab results when he calls back.   Thanks,   -Mickel Baas

## 2020-09-24 NOTE — Telephone Encounter (Signed)
-----   Message from Margo Common, PA-C sent at 09/24/2020  8:00 AM EST ----- All tests essentially normal. Cholesterol in good control with the Lipitor (atorvastatin) 40 mg qd. Continue this dosage to maintain control and work on weight loss with a low fat diet and exercise. Recheck appointment in 6 months.

## 2020-09-30 ENCOUNTER — Ambulatory Visit: Payer: Self-pay | Admitting: *Deleted

## 2020-09-30 NOTE — Telephone Encounter (Signed)
Pt called in and was given his lab results from [employer], PA-C.  Reason for Disposition . Health Information question, no triage required and triager able to answer question  Answer Assessment - Initial Assessment Questions 1. REASON FOR CALL or QUESTION: "What is your reason for calling today?" or "How can I best help you?" or "What question do you have that I can help answer?"     He called in and was given his lab result message from [facility], PA-C.  6 month recheck scheduled with Vernie Murders on July 21,2022 at 2:20 PM.    I changed his pharmacy at his request [facility] on [phone].  Protocols used: INFORMATION ONLY CALL - NO TRIAGE-A-AH

## 2020-09-30 NOTE — Telephone Encounter (Signed)
Tried calling patient. Left message to call back. OK for PEC triage to advise of results.  °

## 2021-01-28 ENCOUNTER — Ambulatory Visit: Payer: Self-pay

## 2021-01-28 NOTE — Telephone Encounter (Signed)
FYI

## 2021-01-28 NOTE — Telephone Encounter (Signed)
Pt. Started having symptoms 2 days ago. "I feel like I have the flu." Body aches, cough,fatigue,headache. No availability in the practice today per Arbie Cookey.Pt. states he will go to [facility].  Reason for Disposition . Fever present > 3 days (72 hours)  Answer Assessment - Initial Assessment Questions 1. ONSET: "When did the muscle aches or body pains start?"      2 days ago 2. LOCATION: "What part of your body is hurting?" (e.g., entire body, arms, legs)      All over 3. SEVERITY: "How bad is the pain?" (Scale 1-10; or mild, moderate, severe)   - MILD (1-3): doesn't interfere with normal activities    - MODERATE (4-7): interferes with normal activities or awakens from sleep    - SEVERE (8-10):  excruciating pain, unable to do any normal activities      Moderate 4. CAUSE: "What do you think is causing the pains?"     Flu 5. FEVER: "Have you been having fever?"     Feels warm 6. OTHER SYMPTOMS: "Do you have any other symptoms?" (e.g., chest pain, weakness, rash, cold or flu symptoms, weight loss)     Cough, headache,body aches 7. PREGNANCY: "Is there any chance you are pregnant?" "When was your last menstrual period?"     N/A 8. TRAVEL: "Have you traveled out of the country in the last month?" (e.g., travel history, exposures)     No  Protocols used: MUSCLE ACHES AND BODY PAIN-A-AH

## 2021-03-31 ENCOUNTER — Ambulatory Visit: Payer: Self-pay | Admitting: Family Medicine

## 2021-04-12 ENCOUNTER — Ambulatory Visit: Payer: Self-pay | Admitting: Family Medicine

## 2021-04-25 ENCOUNTER — Ambulatory Visit: Payer: Self-pay | Admitting: Family Medicine

## 2021-05-10 ENCOUNTER — Other Ambulatory Visit: Payer: Self-pay

## 2021-05-10 ENCOUNTER — Encounter: Payer: Self-pay | Admitting: Family Medicine

## 2021-05-10 ENCOUNTER — Ambulatory Visit: Payer: Managed Care, Other (non HMO) | Admitting: Family Medicine

## 2021-05-10 VITALS — BP 121/74 | HR 75 | Temp 97.7°F | Resp 18 | Ht 72.0 in | Wt 281.4 lb

## 2021-05-10 DIAGNOSIS — E669 Obesity, unspecified: Secondary | ICD-10-CM | POA: Diagnosis not present

## 2021-05-10 DIAGNOSIS — E7800 Pure hypercholesterolemia, unspecified: Secondary | ICD-10-CM | POA: Diagnosis not present

## 2021-05-10 MED ORDER — ATORVASTATIN CALCIUM 40 MG PO TABS: 40.0000 mg | ORAL_TABLET | Freq: Every day | ORAL | 3 refills | Status: DC

## 2021-05-10 NOTE — Progress Notes (Signed)
Established patient visit   Patient: Caleb Lopez   DOB: 1961/08/14   60 y.o. Male  MRN: [id number] Visit Date: 05/10/2021  Today's healthcare provider: Vernie Murders, PA-C   Chief Complaint  Patient presents with   Hyperlipidemia   Subjective  -------------------------------------------------------------------------------------------------------------------- HPI  Lipid/Cholesterol, Follow-up  Last lipid panel Other pertinent labs  Lab Results  Component Value Date   CHOL 135 09/23/2020   HDL 41 09/23/2020   LDLCALC 73 09/23/2020   TRIG 116 09/23/2020   CHOLHDL 3.3 09/23/2020   Lab Results  Component Value Date   ALT 29 09/23/2020   AST 18 09/23/2020   PLT 259 06/21/2020   TSH 1.850 06/21/2020     He was last seen for this 7 months ago.  Management since that visit includes continuing same medications and advising patient to work on weight loss with a low fat diet and exercise.  He reports good compliance with treatment. He is not having side effects.   Symptoms: No chest pain No chest pressure/discomfort  No dyspnea No lower extremity edema  No numbness or tingling of extremity No orthopnea  No palpitations No paroxysmal nocturnal dyspnea  No speech difficulty No syncope   Current diet: in general, an "unhealthy" diet Current exercise: none  The 10-year ASCVD risk score Mikey Bussing DC Jr., et al., 2013) is: 7.6%  ---------------------------------------------------------------------------------------------------   No past medical history on file. Past Surgical History:  Procedure Laterality Date   COLONOSCOPY WITH PROPOFOL N/A 06/03/2019   Procedure: COLONOSCOPY WITH PROPOFOL;  Surgeon: Lucilla Lame, MD;  Location: [facility] ENDOSCOPY;  Service: Endoscopy;  Laterality: N/A;   THORACENTESIS  1980'S   x's 2 probably infection   TONSILLECTOMY AND ADENOIDECTOMY  1960'S   Social History   Tobacco Use   Smoking status: Former    Types: Cigarettes    Quit  date: 09/11/1988    Years since quitting: 32.6   Smokeless tobacco: Never  Vaping Use   Vaping Use: Never used  Substance Use Topics   Alcohol use: Yes    Comment: occasional   Drug use: Never   Family Status  Relation Name Status   Mother  Alive   Father  Alive   Sister  Alive       pituitary adenoma   No Known Allergies    Medications: Outpatient Medications Prior to Visit  Medication Sig   atorvastatin (LIPITOR) 40 MG tablet Take 1 tablet (40 mg total) by mouth daily.   No facility-administered medications prior to visit.    Review of Systems  Constitutional:  Negative for appetite change, chills and fever.  Respiratory:  Negative for chest tightness, shortness of breath and wheezing.   Cardiovascular:  Negative for chest pain and palpitations.  Gastrointestinal:  Negative for abdominal pain, nausea and vomiting.   Last CBC Lab Results  Component Value Date   WBC 6.5 06/21/2020   HGB 14.9 06/21/2020   HCT 43.4 06/21/2020   MCV 90 06/21/2020   MCH 31.0 06/21/2020   RDW 13.3 06/21/2020   PLT 259 [id number]   Last metabolic panel Lab Results  Component Value Date   GLUCOSE 90 09/23/2020   NA 143 09/23/2020   K 4.4 09/23/2020   CL 110 (H) 09/23/2020   CO2 20 09/23/2020   BUN 14 09/23/2020   CREATININE 0.82 09/23/2020   GFRNONAA 95 09/23/2020   GFRAA 110 09/23/2020   CALCIUM 9.5 09/23/2020   PROT 6.5 09/23/2020   ALBUMIN 4.5  09/23/2020   LABGLOB 2.0 09/23/2020   AGRATIO 2.3 (H) 09/23/2020   BILITOT 0.4 09/23/2020   ALKPHOS 116 09/23/2020   AST 18 09/23/2020   ALT 29 09/23/2020   ANIONGAP 7 05/27/2012   Last lipids Lab Results  Component Value Date   CHOL 135 09/23/2020   HDL 41 09/23/2020   LDLCALC 73 09/23/2020   TRIG 116 09/23/2020   CHOLHDL 3.3 09/23/2020       Objective  -------------------------------------------------------------------------------------------------------------------- BP 121/74 (BP Location: Right Arm, Patient Position:  Sitting, Cuff Size: Large)   Pulse 75   Temp 97.7 F (36.5 C) (Temporal)   Resp 18   Ht 6' (1.829 m)   Wt 281 lb 6.4 oz (127.6 kg)   BMI 38.16 kg/m  Wt Readings from Last 3 Encounters:  05/10/21 281 lb 6.4 oz (127.6 kg)  09/23/20 279 lb 12.8 oz (126.9 kg)  06/21/20 269 lb (122 kg)    Physical Exam Constitutional:      General: He is not in acute distress.    Appearance: He is well-developed.  HENT:     Head: Normocephalic and atraumatic.     Right Ear: Hearing normal.     Left Ear: Hearing normal.     Nose: Nose normal.  Eyes:     General: Lids are normal. No scleral icterus.       Right eye: No discharge.        Left eye: No discharge.     Conjunctiva/sclera: Conjunctivae normal.  Cardiovascular:     Rate and Rhythm: Normal rate and regular rhythm.     Pulses: Normal pulses.     Heart sounds: Normal heart sounds.  Pulmonary:     Effort: Pulmonary effort is normal. No respiratory distress.     Breath sounds: Normal breath sounds.  Abdominal:     General: Bowel sounds are normal.     Palpations: Abdomen is soft.  Musculoskeletal:        General: Normal range of motion.     Cervical back: Normal range of motion and neck supple.  Skin:    Findings: No lesion or rash.  Neurological:     Mental Status: He is alert and oriented to person, place, and time.  Psychiatric:        Speech: Speech normal.        Behavior: Behavior normal.        Thought Content: Thought content normal.     No results found for any visits on 05/10/21.  Assessment & Plan  ---------------------------------------------------------------------------------------------------------------------- 1. Pure hypercholesterolemia Continues Atovastatin and Krill Oil. May add Co-q 10 200 mg qd and should get back on low fat diet with working on weight loss. Recheck labs and refilled Lipitor prescription. - Comprehensive metabolic panel - Lipid panel - atorvastatin (LIPITOR) 40 MG tablet; Take 1 tablet  (40 mg total) by mouth daily.  Dispense: 90 tablet; Refill: 3  2. Obesity, Class II, BMI 35-39.9 Has regained some weight, but not following low fat diet well (loves vegetable pizzas with extra cheese). Recheck labs and encouraged to exercise regularly. Follow up pending  report. - Comprehensive metabolic panel - Lipid panel   No follow-ups on file.      I, Brode Sculley, PA-C, have reviewed all documentation for this visit. The documentation on 05/10/21 for the exam, diagnosis, procedures, and orders are all accurate and complete.    Vernie Murders, PA-C  [employer] [phone] (phone) [phone] (fax)  [place]

## 2021-05-11 LAB — LIPID PANEL
Chol/HDL Ratio: 3.6 ratio (ref 0.0–5.0)
Cholesterol, Total: 128 mg/dL (ref 100–199)
HDL: 36 mg/dL — ABNORMAL LOW (ref >39)
LDL Chol Calc (NIH): 71 mg/dL (ref 0–99)
Triglycerides: 116 mg/dL (ref 0–149)
VLDL Cholesterol Cal: 21 mg/dL (ref 5–40)

## 2021-05-11 LAB — COMPREHENSIVE METABOLIC PANEL
ALT: 15 IU/L (ref 0–44)
AST: 16 IU/L (ref 0–40)
Albumin/Globulin Ratio: 1.9 (ref 1.2–2.2)
Albumin: 4.4 g/dL (ref 3.8–4.8)
Alkaline Phosphatase: 108 IU/L (ref 44–121)
BUN/Creatinine Ratio: 17 (ref 10–24)
BUN: 15 mg/dL (ref 8–27)
Bilirubin Total: 0.6 mg/dL (ref 0.0–1.2)
CO2: 21 mmol/L (ref 20–29)
Calcium: 9.7 mg/dL (ref 8.6–10.2)
Chloride: 106 mmol/L (ref 96–106)
Creatinine, Ser: 0.89 mg/dL (ref 0.76–1.27)
Globulin, Total: 2.3 g/dL (ref 1.5–4.5)
Glucose: 94 mg/dL (ref 65–99)
Potassium: 4.3 mmol/L (ref 3.5–5.2)
Sodium: 141 mmol/L (ref 134–144)
Total Protein: 6.7 g/dL (ref 6.0–8.5)
eGFR: 97 mL/min/{1.73_m2} (ref >59)

## 2021-05-17 NOTE — Progress Notes (Signed)
Mailed lab results. 

## 2021-06-21 ENCOUNTER — Telehealth: Payer: Self-pay

## 2021-06-21 NOTE — Telephone Encounter (Signed)
Copied from [place] [id number]. Topic: Appointment Scheduling - Scheduling Inquiry for Clinic >> Jun 21, 2021 12:24 PM Yvette Rack wrote: Reason for CRM: Pt would like to remain a patient of the practice and requests to establish with one of the new providers.

## 2021-06-22 NOTE — Telephone Encounter (Signed)
Tried calling patient numerous times.  His voicemail is full and cannot accept messages.     This patient and his son may see the new PAs who are coming into [facility] 06/27/21 and 07/17/21.

## 2021-08-08 ENCOUNTER — Encounter: Payer: Self-pay | Admitting: Family Medicine

## 2021-08-08 ENCOUNTER — Other Ambulatory Visit: Payer: Self-pay

## 2021-08-08 ENCOUNTER — Ambulatory Visit: Payer: Managed Care, Other (non HMO) | Admitting: Family Medicine

## 2021-08-08 VITALS — BP 113/61 | HR 73 | Temp 98.6°F | Wt 281.0 lb

## 2021-08-08 DIAGNOSIS — E7800 Pure hypercholesterolemia, unspecified: Secondary | ICD-10-CM

## 2021-08-08 DIAGNOSIS — Z23 Encounter for immunization: Secondary | ICD-10-CM | POA: Diagnosis not present

## 2021-08-08 DIAGNOSIS — Z6838 Body mass index (BMI) 38.0-38.9, adult: Secondary | ICD-10-CM | POA: Insufficient documentation

## 2021-08-08 DIAGNOSIS — I2510 Atherosclerotic heart disease of native coronary artery without angina pectoris: Secondary | ICD-10-CM

## 2021-08-08 NOTE — Assessment & Plan Note (Signed)
Due for repeat labs No difficulties taking medication

## 2021-08-08 NOTE — Progress Notes (Signed)
Established patient visit   Patient: Caleb Lopez   DOB: Feb 15, 1961   60 y.o. Male  MRN: [id number] Visit Date: 08/08/2021  Today's healthcare provider: Gwyneth Sprout, FNP   Chief Complaint  Patient presents with   Hyperlipidemia   Subjective    HPI  Lipid/Cholesterol, Follow-up  Last lipid panel Other pertinent labs  Lab Results  Component Value Date   CHOL 128 05/10/2021   HDL 36 (L) 05/10/2021   LDLCALC 71 05/10/2021   TRIG 116 05/10/2021   CHOLHDL 3.6 05/10/2021   Lab Results  Component Value Date   ALT 15 05/10/2021   AST 16 05/10/2021   PLT 259 06/21/2020   TSH 1.850 06/21/2020     He was last seen for this 3 months ago.  Management since that visit includes Continues Atovastatin and [employer]. May add Co-q 10 200 mg qd and should get back on low fat diet with working on weight loss.Marland Kitchen  He reports excellent compliance with treatment. He is not having side effects.   Symptoms: No chest pain No chest pressure/discomfort  No dyspnea No lower extremity edema  No numbness or tingling of extremity No orthopnea  No palpitations No paroxysmal nocturnal dyspnea  No speech difficulty No syncope   Current diet: well balanced Current exercise: no regular exercise  The ASCVD Risk score (Arnett DK, et al., 2019) failed to calculate for the following reasons:   The valid total cholesterol range is 130 to 320 mg/dL  ---------------------------------------------------------------------------------------------------   Medications: Outpatient Medications Prior to Visit  Medication Sig   atorvastatin (LIPITOR) 40 MG tablet Take 1 tablet (40 mg total) by mouth daily.   co-enzyme Q-10 30 MG capsule Take 200 mg by mouth daily.   Krill Oil 500 MG CAPS Take 1 capsule by mouth 1 day or 1 dose.   No facility-administered medications prior to visit.    Review of Systems     Objective    BP 113/61 (BP Location: Right Arm, Patient Position: Sitting, Cuff Size:  Normal)   Pulse 73   Temp 98.6 F (37 C) (Oral)   Wt 281 lb (127.5 kg)   SpO2 98%   BMI 38.11 kg/m    Physical Exam Vitals and nursing note reviewed.  Constitutional:      Appearance: Normal appearance. He is obese.  HENT:     Head: Normocephalic and atraumatic.  Eyes:     Pupils: Pupils are equal, round, and reactive to light.  Cardiovascular:     Rate and Rhythm: Normal rate and regular rhythm.     Pulses: Normal pulses.     Heart sounds: Normal heart sounds.  Pulmonary:     Effort: Pulmonary effort is normal.     Breath sounds: Normal breath sounds.  Musculoskeletal:        General: Normal range of motion.     Cervical back: Normal range of motion.  Skin:    General: Skin is warm and dry.     Capillary Refill: Capillary refill takes less than 2 seconds.  Neurological:     General: No focal deficit present.     Mental Status: He is alert and oriented to person, place, and time. Mental status is at baseline.  Psychiatric:        Mood and Affect: Mood normal.        Behavior: Behavior normal.        Thought Content: Thought content normal.  Judgment: Judgment normal.      No results found for any visits on 08/08/21.  Assessment & Plan     Problem List Items Addressed This Visit       Cardiovascular and Mediastinum   Coronary artery disease    Chronic, stable Recommend weight reduction Recommend exercise         Other   Pure hypercholesterolemia - Primary    Due for repeat labs No difficulties taking medication      Relevant Orders   Lipid panel   Flu vaccine need    Consent reviewed      Relevant Orders   Flu Vaccine QUAD 64mo+IM (Fluarix, Fluzone & Alfiuria Quad PF) (Completed)   Morbid obesity (HCC)    HTN, CAD      BMI 38.0-38.9,adult    BMI 38.11 Discussed importance of healthy weight management Discussed diet and exercise         Return for annual examination- schedule at your will.      Vonna Kotyk, FNP, have  reviewed all documentation for this visit. The documentation on 08/08/21 for the exam, diagnosis, procedures, and orders are all accurate and complete.    Gwyneth Sprout, [place] [phone] (phone) [phone] (fax)  [place]

## 2021-08-08 NOTE — Assessment & Plan Note (Signed)
Consent reviewed

## 2021-08-08 NOTE — Assessment & Plan Note (Signed)
HTN, CAD

## 2021-08-08 NOTE — Assessment & Plan Note (Signed)
Chronic, stable Recommend weight reduction Recommend exercise

## 2021-08-08 NOTE — Assessment & Plan Note (Signed)
BMI 38.11 Discussed importance of healthy weight management Discussed diet and exercise

## 2021-08-09 LAB — LIPID PANEL
Chol/HDL Ratio: 3.3 ratio (ref 0.0–5.0)
Cholesterol, Total: 124 mg/dL (ref 100–199)
HDL: 38 mg/dL — ABNORMAL LOW (ref >39)
LDL Chol Calc (NIH): 68 mg/dL (ref 0–99)
Triglycerides: 94 mg/dL (ref 0–149)
VLDL Cholesterol Cal: 18 mg/dL (ref 5–40)

## 2022-01-30 ENCOUNTER — Ambulatory Visit: Payer: Managed Care, Other (non HMO) | Admitting: Dermatology

## 2022-01-30 ENCOUNTER — Encounter: Payer: Self-pay | Admitting: Dermatology

## 2022-01-30 DIAGNOSIS — L578 Other skin changes due to chronic exposure to nonionizing radiation: Secondary | ICD-10-CM

## 2022-01-30 DIAGNOSIS — Z1283 Encounter for screening for malignant neoplasm of skin: Secondary | ICD-10-CM

## 2022-01-30 DIAGNOSIS — B353 Tinea pedis: Secondary | ICD-10-CM | POA: Diagnosis not present

## 2022-01-30 DIAGNOSIS — L821 Other seborrheic keratosis: Secondary | ICD-10-CM

## 2022-01-30 DIAGNOSIS — L814 Other melanin hyperpigmentation: Secondary | ICD-10-CM | POA: Diagnosis not present

## 2022-01-30 DIAGNOSIS — L820 Inflamed seborrheic keratosis: Secondary | ICD-10-CM | POA: Diagnosis not present

## 2022-01-30 DIAGNOSIS — D229 Melanocytic nevi, unspecified: Secondary | ICD-10-CM

## 2022-01-30 DIAGNOSIS — L905 Scar conditions and fibrosis of skin: Secondary | ICD-10-CM | POA: Diagnosis not present

## 2022-01-30 DIAGNOSIS — D1800 Hemangioma unspecified site: Secondary | ICD-10-CM

## 2022-01-30 MED ORDER — KETOCONAZOLE 2 % EX CREA: TOPICAL_CREAM | CUTANEOUS | 11 refills | Status: DC

## 2022-01-30 NOTE — Patient Instructions (Addendum)
Call in 6 weeks if lesion on chest has not resolved.   Start Ketoconazole 2% cream twice daily to feet.    Cryotherapy Aftercare  Wash gently with soap and water everyday.   Apply Vaseline jelly daily until healed.   Prior to procedure, discussed risks of blister formation, small wound, skin dyspigmentation, or rare scar following cryotherapy. Recommend Vaseline ointment to treated areas while healing.     Melanoma ABCDEs  Melanoma is the most dangerous type of skin cancer, and is the leading cause of death from skin disease.  You are more likely to develop melanoma if you: Have light-colored skin, light-colored eyes, or red or blond hair Spend a lot of time in the sun Tan regularly, either outdoors or in a tanning bed Have had blistering sunburns, especially during childhood Have a close family member who has had a melanoma Have atypical moles or large birthmarks  Early detection of melanoma is key since treatment is typically straightforward and cure rates are extremely high if we catch it early.   The first sign of melanoma is often a change in a mole or a new dark spot.  The ABCDE system is a way of remembering the signs of melanoma.  A for asymmetry:  The two halves do not match. B for border:  The edges of the growth are irregular. C for color:  A mixture of colors are present instead of an even brown color. D for diameter:  Melanomas are usually (but not always) greater than 65m - the size of a pencil eraser. E for evolution:  The spot keeps changing in size, shape, and color.  Please check your skin once per month between visits. You can use a small mirror in front and a large mirror behind you to keep an eye on the back side or your body.   If you see any new or changing lesions before your next follow-up, please call to schedule a visit.  Please continue daily skin protection including broad spectrum sunscreen SPF 30+ to sun-exposed areas, reapplying every 2 hours as  needed when you're outdoors.   Staying in the shade or wearing long sleeves, sun glasses (UVA+UVB protection) and wide brim hats (4-inch brim around the entire circumference of the hat) are also recommended for sun protection.     If You Need Anything After Your Visit  If you have any questions or concerns for your doctor, please call our main line at [phone] press option 4 to reach your doctor's medical assistant. If no one answers, please leave a voicemail as directed and we will return your call as soon as possible. Messages left after 4 pm will be answered the following business day.   You may also send [place] message via [place] We typically respond to MyChart messages within 1-2 business days.  For prescription refills, please ask your pharmacy to contact our office. Our fax number is [phone]  If you have an urgent issue when the clinic is closed that cannot wait until the next business day, you can page your doctor at the number below.    Please note that while we do our best to be available for urgent issues outside of office hours, we are not available 24/7.   If you have an urgent issue and are unable to reach [place] you may choose to seek medical care at your doctor's office, retail clinic, urgent care center, or emergency room.  If you have a medical emergency, please immediately  call 911 or go to the emergency department.  Pager Numbers  - Dr. Nehemiah Massed: [phone]  - Dr. Laurence Ferrari: [phone]  - Dr. Nicole Kindred: [phone]  In the event of inclement weather, please call our main line at [phone] for an update on the status of any delays or closures.  Dermatology Medication Tips: Please keep the boxes that topical medications come in in order to help keep track of the instructions about where and how to use these. Pharmacies typically print the medication instructions only on the boxes and not directly on the medication tubes.   If your medication is too  expensive, please contact our office at [phone] option 4 or send [place] a message through [place].   We are unable to tell what your co-pay for medications will be in advance as this is different depending on your insurance coverage. However, we may be able to find a substitute medication at lower cost or fill out paperwork to get insurance to cover a needed medication.   If a prior authorization is required to get your medication covered by your insurance company, please allow [place] 1-2 business days to complete this process.  Drug prices often vary depending on where the prescription is filled and some pharmacies may offer cheaper prices.  The website www.goodrx.com contains coupons for medications through different pharmacies. The prices here do not account for what the cost may be with help from insurance (it may be cheaper with your insurance), but the website can give you the price if you did not use any insurance.  - You can print the associated coupon and take it with your prescription to the pharmacy.  - You may also stop by our office during regular business hours and pick up a GoodRx coupon card.  - If you need your prescription sent electronically to a different pharmacy, notify our office through [facility] or by phone at [phone] option 4.     Si Usted Necesita Algo Despus de Su Visita  Tambin puede enviarnos un mensaje a travs de Pharmacist, community. Por lo general respondemos a los mensajes de MyChart en el transcurso de 1 a 2 das hbiles.  Para renovar recetas, por favor pida a su farmacia que se ponga en contacto con nuestra oficina. Harland Dingwall de fax es [place] [phone].  Si tiene un asunto urgente cuando la clnica est cerrada y que no puede esperar hasta el siguiente da hbil, puede llamar/localizar a su doctor(a) al nmero que aparece a continuacin.   Por favor, tenga en cuenta que aunque hacemos todo lo posible para estar disponibles para asuntos urgentes fuera  del horario de [place], no estamos disponibles las 24 horas del da, los 7 das de la [place].   Si tiene un problema urgente y no puede comunicarse con nosotros, puede optar por buscar atencin mdica  en el consultorio de su doctor(a), en una clnica privada, en un centro de atencin urgente o en una sala de emergencias.  Si tiene Engineering geologist, por favor llame inmediatamente al 911 o vaya a la sala de emergencias.  Nmeros de bper  - Dr. Nehemiah Massed: [phone]  - Dra. Moye: [phone]  - Dra. Nicole Kindred: [phone]  En caso de inclemencias del [place], por favor llame a Johnsie Kindred principal al [phone] para una actualizacin sobre el [place] de cualquier retraso o cierre.  Consejos para la medicacin en dermatologa: Por favor, guarde las cajas en las que vienen los medicamentos de uso tpico para ayudarle a seguir las instrucciones  sobre dnde y cmo usarlos. Las farmacias generalmente imprimen las instrucciones del medicamento slo en las cajas y no directamente en los tubos del [place].   Si su medicamento es muy caro, por favor, pngase en contacto con Zigmund Daniel llamando al [phone] y presione la opcin 4 o envenos un mensaje a travs de Pharmacist, community.   No podemos decirle cul ser su copago por los medicamentos por adelantado ya que esto es diferente dependiendo de la cobertura de su seguro. Sin embargo, es posible que podamos encontrar un medicamento sustituto a Electrical engineer un formulario para que el seguro cubra el medicamento que se considera necesario.   Si se requiere una autorizacin previa para que su compaa de seguros [place] su medicamento, por favor permtanos de 1 a 2 das hbiles para completar este proceso.  Los precios de los medicamentos varan con frecuencia dependiendo del Environmental consultant de dnde se surte la receta y alguna farmacias pueden ofrecer precios ms baratos.  El sitio web www.goodrx.com tiene cupones para medicamentos de Office manager. Los precios aqu no tienen en cuenta lo que podra costar con la ayuda del seguro (puede ser ms barato con su seguro), pero el sitio web puede darle el precio si no utiliz Research scientist (physical sciences).  - Puede imprimir el cupn correspondiente y llevarlo con su receta a la farmacia.  - Tambin puede pasar por nuestra oficina durante el horario de atencin regular y Charity fundraiser una tarjeta de cupones de GoodRx.  - Si necesita que su receta se enve electrnicamente a una farmacia diferente, informe a nuestra oficina a travs de MyChart de [place] o por telfono llamando al [phone] y presione la opcin 4.

## 2022-01-30 NOTE — Progress Notes (Signed)
New Patient Visit  Subjective  Caleb Lopez is a 61 y.o. male who presents for the following: Annual Exam (Skin cancer screening. Full body. Has been seen in our office several years ago. Had bx on penis, then had cancer cream to use, does not know diagnosis). The patient presents for Total-Body Skin Exam (TBSE) for skin cancer screening and mole check.  The patient has spots, moles and lesions to be evaluated, some may be new or changing and the patient has concerns that these could be cancer.  Review of Systems: No other skin or systemic complaints except as noted in HPI or Assessment and Plan.  Objective  Well appearing patient in no apparent distress; mood and affect are within normal limits.  A full examination was performed including scalp, head, eyes, ears, nose, lips, neck, chest, axillae, abdomen, back, buttocks, bilateral upper extremities, bilateral lower extremities, hands, feet, fingers, toes, fingernails, and toenails. All findings within normal limits unless otherwise noted below.  right lateral brow x1, left side burn x1, right lateral cheek x1, mid chest x1, right anterior waist line x1 (5) Erythematous keratotic or waxy stuck-on papule or plaque.  feet Scaling and maceration web spaces and over distal and lateral soles.   Dorsal Penile Shaft Dyspigmented smooth macule or patch.    Assessment & Plan   Lentigines - Scattered tan macules - Due to sun exposure - Benign-appearing, observe - Recommend daily broad spectrum sunscreen SPF 30+ to sun-exposed areas, reapply every 2 hours as needed. - Call for any changes  Seborrheic Keratoses - Stuck-on, waxy, tan-brown papules and/or plaques  - Benign-appearing - Discussed benign etiology and prognosis. - Observe - Call for any changes  Melanocytic Nevi - Tan-brown and/or pink-flesh-colored symmetric macules and papules - Benign appearing on exam today - Observation - Call clinic for new or changing moles -  Recommend daily use of broad spectrum spf 30+ sunscreen to sun-exposed areas.   Hemangiomas - Red papules - Discussed benign nature - Observe - Call for any changes  Actinic Damage - Chronic condition, secondary to cumulative UV/sun exposure - diffuse scaly erythematous macules with underlying dyspigmentation - Recommend daily broad spectrum sunscreen SPF 30+ to sun-exposed areas, reapply every 2 hours as needed.  - Staying in the shade or wearing long sleeves, sun glasses (UVA+UVB protection) and wide brim hats (4-inch brim around the entire circumference of the hat) are also recommended for sun protection.  - Call for new or changing lesions.  Skin cancer screening performed today.  Inflamed seborrheic keratosis (5) right lateral brow x1, left side burn x1, right lateral cheek x1, mid chest x1, right anterior waist line x1 Symptomatic, irritating, patient would like treated.  Call in 6 weeks if lesion on chest has not resolved.   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 Condition is symptomatic / bothersome to patient. Not to goal. Start Ketoconazole 2% cream  twice daily to feet.   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   Skin cancer screening  Return in about 1 year (around 01/31/2023) for TBSE.  I, Emelia Salisbury, CMA, am acting as scribe for Sarina Ser, MD. Documentation: I have reviewed the above documentation for accuracy and completeness, and I agree with the above.  Sarina Ser, MD

## 2022-02-06 ENCOUNTER — Encounter: Payer: Self-pay | Admitting: Dermatology

## 2022-03-21 ENCOUNTER — Encounter: Payer: Managed Care, Other (non HMO) | Admitting: Family Medicine

## 2022-03-23 NOTE — Progress Notes (Unsigned)
I,Caleb Lopez,acting as a Neurosurgeon for Caleb Kindle, FNP.,have documented all relevant documentation on the behalf of Caleb Kindle, FNP,as directed by  Caleb Kindle, FNP while in the presence of Caleb Kindle, FNP.   Complete physical exam   Patient: Caleb Lopez   DOB: 13-Jul-1961   61 y.o. Male  MRN: [id number] Visit Date: 03/29/2022  Today's healthcare provider: Jacky Kindle, FNP  Re Introduced to nurse practitioner role and practice setting.  All questions answered.  Discussed provider/patient relationship and expectations.   Chief Complaint  Patient presents with   Annual Exam   Subjective    LYRIK Lopez is a 61 y.o. male who presents today for a complete physical exam.  He reports consuming a  no meat in over 15 years  diet. He is  stretching  for exercise. He generally feels well. He reports sleeping well. He does have additional problems to discuss today- anal bleeding.   HPI   History reviewed. No pertinent past medical history. Past Surgical History:  Procedure Laterality Date   COLONOSCOPY WITH PROPOFOL N/A 06/03/2019   Procedure: COLONOSCOPY WITH PROPOFOL;  Surgeon: Midge Minium, MD;  Location: [facility] ENDOSCOPY;  Service: Endoscopy;  Laterality: N/A;   THORACENTESIS  1980'S   x's 2 probably infection   TONSILLECTOMY AND ADENOIDECTOMY  1960'S   Social History   Socioeconomic History   Marital status: Married    Spouse name: Not on file   Number of children: Not on file   Years of education: Not on file   Highest education level: Not on file  Occupational History   Not on file  Tobacco Use   Smoking status: Former    Types: Cigarettes    Quit date: 09/11/1988    Years since quitting: 33.5   Smokeless tobacco: Never  Vaping Use   Vaping Use: Never used  Substance and Sexual Activity   Alcohol use: Yes    Comment: occasional   Drug use: Never   Sexual activity: Not on file  Other Topics Concern   Not on file  Social History Narrative    Not on file   Social Determinants of Health   Financial Resource Strain: Not on file  Food Insecurity: Not on file  Transportation Needs: Not on file  Physical Activity: Not on file  Stress: Not on file  Social Connections: Not on file  Intimate Partner Violence: Not on file   Family Status  Relation Name Status   Mother  Alive   Father  Alive   Sister  Alive       pituitary adenoma   Family History  Problem Relation Age of Onset   Diverticulitis Mother    Arthritis Mother    Glaucoma Mother    Diabetes Father    No Known Allergies  Patient Care Team: Caleb Kindle, FNP as PCP - General (Family Medicine)   Medications: Outpatient Medications Prior to Visit  Medication Sig   atorvastatin (LIPITOR) 40 MG tablet Take 1 tablet (40 mg total) by mouth daily.   ketoconazole (NIZORAL) 2 % cream Apply twice daily to feet   Krill Oil 500 MG CAPS Take 1 capsule by mouth 1 day or 1 dose.   co-enzyme Q-10 30 MG capsule Take 200 mg by mouth daily. (Patient not taking: Reported on 03/29/2022)   No facility-administered medications prior to visit.    Review of Systems  Gastrointestinal:  Positive for anal bleeding.  All  other systems reviewed and are negative.  Last CBC Lab Results  Component Value Date   WBC 6.5 06/21/2020   HGB 14.9 06/21/2020   HCT 43.4 06/21/2020   MCV 90 06/21/2020   MCH 31.0 06/21/2020   RDW 13.3 06/21/2020   PLT 259 24/46/2863   Last metabolic panel Lab Results  Component Value Date   GLUCOSE 94 05/10/2021   NA 141 05/10/2021   K 4.3 05/10/2021   CL 106 05/10/2021   CO2 21 05/10/2021   BUN 15 05/10/2021   CREATININE 0.89 05/10/2021   EGFR 97 05/10/2021   CALCIUM 9.7 05/10/2021   PROT 6.7 05/10/2021   ALBUMIN 4.4 05/10/2021   LABGLOB 2.3 05/10/2021   AGRATIO 1.9 05/10/2021   BILITOT 0.6 05/10/2021   ALKPHOS 108 05/10/2021   AST 16 05/10/2021   ALT 15 05/10/2021   ANIONGAP 7 05/27/2012   Last lipids Lab Results  Component Value  Date   CHOL 124 08/08/2021   HDL 38 (L) 08/08/2021   LDLCALC 68 08/08/2021   TRIG 94 08/08/2021   CHOLHDL 3.3 08/08/2021   Last hemoglobin A1c Lab Results  Component Value Date   HGBA1C 5.5 06/21/2020   Last thyroid functions Lab Results  Component Value Date   TSH 1.850 06/21/2020      Objective     BP 113/74 (BP Location: Right Arm, Patient Position: Sitting, Cuff Size: Large)   Pulse 84   Temp 98.7 F (37.1 C) (Oral)   Resp 16   Ht 6' (1.829 m)   Wt 292 lb (132.5 kg)   BMI 39.60 kg/m   BP Readings from Last 3 Encounters:  03/29/22 113/74  08/08/21 113/61  05/10/21 121/74   Wt Readings from Last 3 Encounters:  03/29/22 292 lb (132.5 kg)  08/08/21 281 lb (127.5 kg)  05/10/21 281 lb 6.4 oz (127.6 kg)   Physical Exam Vitals and nursing note reviewed.  Constitutional:      General: He is awake. He is not in acute distress.    Appearance: Normal appearance. He is well-developed and well-groomed. He is obese. He is not ill-appearing, toxic-appearing or diaphoretic.  HENT:     Head: Normocephalic and atraumatic.     Jaw: There is normal jaw occlusion. No trismus, tenderness, swelling or pain on movement.     Salivary Glands: Right salivary gland is not diffusely enlarged or tender. Left salivary gland is not diffusely enlarged or tender.     Right Ear: Hearing, tympanic membrane, ear canal and external ear normal. There is no impacted cerumen.     Left Ear: Hearing, tympanic membrane, ear canal and external ear normal. There is no impacted cerumen.     Nose: Nose normal. No congestion or rhinorrhea.     Right Turbinates: Not enlarged, swollen or pale.     Left Turbinates: Not enlarged, swollen or pale.     Right Sinus: No maxillary sinus tenderness or frontal sinus tenderness.     Left Sinus: No maxillary sinus tenderness or frontal sinus tenderness.     Mouth/Throat:     Lips: Pink.     Mouth: Mucous membranes are moist. No injury, lacerations, oral lesions or  angioedema.     Pharynx: Oropharynx is clear. Uvula midline. No pharyngeal swelling, oropharyngeal exudate or posterior oropharyngeal erythema.     Tonsils: No tonsillar exudate or tonsillar abscesses.  Eyes:     General: Lids are normal. Vision grossly intact. Gaze aligned appropriately.  Right eye: No discharge.        Left eye: No discharge.     Extraocular Movements: Extraocular movements intact.     Conjunctiva/sclera: Conjunctivae normal.     Pupils: Pupils are equal, round, and reactive to light.  Neck:     Thyroid: No thyroid mass, thyromegaly or thyroid tenderness.     Vascular: No carotid bruit.     Trachea: Trachea normal. No tracheal tenderness.  Cardiovascular:     Rate and Rhythm: Normal rate and regular rhythm.     Pulses: Normal pulses.          Carotid pulses are 2+ on the right side and 2+ on the left side.      Radial pulses are 2+ on the right side and 2+ on the left side.       Femoral pulses are 2+ on the right side and 2+ on the left side.      Popliteal pulses are 2+ on the right side and 2+ on the left side.       Dorsalis pedis pulses are 2+ on the right side and 2+ on the left side.       Posterior tibial pulses are 2+ on the right side and 2+ on the left side.     Heart sounds: Normal heart sounds, S1 normal and S2 normal. No murmur heard.    No friction rub. No gallop.  Pulmonary:     Effort: Pulmonary effort is normal. No respiratory distress.     Breath sounds: Normal breath sounds and air entry. No stridor. No wheezing, rhonchi or rales.  Chest:     Chest wall: No tenderness.  Abdominal:     General: Abdomen is flat. Bowel sounds are normal. There is no distension.     Palpations: Abdomen is soft. There is no mass.     Tenderness: There is no abdominal tenderness. There is no guarding or rebound.     Hernia: No hernia is present.  Genitourinary:    Comments: Exam deferred; denies complaints Musculoskeletal:        General: No swelling,  tenderness, deformity or signs of injury. Normal range of motion.     Cervical back: Normal range of motion and neck supple. No rigidity or tenderness.     Right lower leg: No edema.     Left lower leg: No edema.  Lymphadenopathy:     Cervical: No cervical adenopathy.     Right cervical: No superficial, deep or posterior cervical adenopathy.    Left cervical: No superficial, deep or posterior cervical adenopathy.  Skin:    General: Skin is warm and dry.     Capillary Refill: Capillary refill takes less than 2 seconds.     Coloration: Skin is not jaundiced or pale.     Findings: No bruising, erythema, lesion or rash.  Neurological:     General: No focal deficit present.     Mental Status: He is alert and oriented to person, place, and time. Mental status is at baseline.     GCS: GCS eye subscore is 4. GCS verbal subscore is 5. GCS motor subscore is 6.     Sensory: Sensation is intact. No sensory deficit.     Motor: Motor function is intact. No weakness.     Coordination: Coordination is intact.     Gait: Gait is intact.  Psychiatric:        Attention and Perception: Attention and perception normal.  Mood and Affect: Mood and affect normal.        Speech: Speech normal.        Behavior: Behavior normal. Behavior is cooperative.        Thought Content: Thought content normal.        Cognition and Memory: Cognition normal.        Judgment: Judgment normal.      Last depression screening scores    03/29/2022    9:22 AM 06/21/2020    1:29 PM 02/13/2019    1:31 PM  PHQ 2/9 Scores  PHQ - 2 Score 0 0 0  PHQ- 9 Score 0     Last fall risk screening    03/29/2022    9:22 AM  [place] in the past year? 0  Number falls in past yr: 0  Injury with Fall? 0  Risk for fall due to : No Fall Risks  Follow up Falls evaluation completed   Last Audit-C alcohol use screening    03/29/2022    9:22 AM  Alcohol Use Disorder Test (AUDIT)  1. How often do you have a drink  containing alcohol? 0  2. How many drinks containing alcohol do you have on a typical day when you are drinking? 0  3. How often do you have six or more drinks on one occasion? 0  AUDIT-C Score 0   A score of 3 or more in women, and 4 or more in men indicates increased risk for alcohol abuse, EXCEPT if all of the points are from question 1   No results found for any visits on 03/29/22.  Assessment & Plan    Routine Health Maintenance and Physical Exam  Exercise Activities and Dietary recommendations  Goals   None     Immunization History  Administered Date(s) Administered   Influenza,inj,Quad PF,6+ Mos 07/30/2014, 07/04/2017, 06/06/2018, 07/11/2019, 06/21/2020, 08/08/2021   Tdap 04/29/2012   Zoster Recombinat (Shingrix) 03/29/2022   Zoster, Live 04/29/2012    Health Maintenance  Topic Date Due   INFLUENZA VACCINE  04/11/2022   TETANUS/TDAP  04/29/2022   Zoster Vaccines- Shingrix (2 of 2) 05/24/2022   COLONOSCOPY (Pts 45-16yr Insurance coverage will need to be confirmed)  06/02/2024   Hepatitis C Screening  Completed   HIV Screening  Completed   HPV VACCINES  Aged Out   COVID-19 Vaccine  Discontinued    Discussed health benefits of physical activity, and encouraged him to engage in regular exercise appropriate for his age and condition.  Problem List Items Addressed This Visit       Cardiovascular and Mediastinum   Coronary artery disease    Chronic, stable; denies CP or DOE/SOB Continues to avoid meat products; >15 years now BMI remains elevated and has increased in last 6 months Body mass index is 39.6 kg/m.         Digestive   Anal bleeding    Acute, recurrent Denies associated symptoms Reports that symptoms last 1 BM; BRB seen on both TP and in toilet Previously discussed with Dr WAllen Norriswith GI Continue to monitor symptoms and report if symptoms continue to worsen or become more frequent      Relevant Orders   CBC with Differential/Platelet      Other   Annual physical exam - Primary    Due for dental; waiting until January Due for vision; use of magnifying glass at work Things to do to keep yourself healthy  - Exercise at least  30-45 minutes a day, 3-4 days a week.  - Eat a low-fat diet with lots of fruits and vegetables, up to 7-9 servings per day.  - Seatbelts can save your life. Wear them always.  - Smoke detectors on every level of your home, check batteries every year.  - Eye Doctor - have an eye exam every 1-2 years  - Safe sex - if you may be exposed to STDs, use a condom.  - Alcohol -  If you drink, do it moderately, less than 2 drinks per day.  - [place]. Choose someone to speak for you if you are not able.  - Depression is common in our stressful world.If you're feeling down or losing interest in things you normally enjoy, please come in for a visit.  - Violence - If anyone is threatening or hurting you, please call immediately.        Relevant Orders   Comprehensive metabolic panel   Lipid Panel With LDL/HDL Ratio   Varicella-zoster vaccine IM (Completed)   Chronic pain syndrome    Chronic, stable Denies use of medication Has elaborate stretching routine to assist given hx of impingement       Morbid obesity (HCC)    Chronic, worsening Body mass index is 39.6 kg/m. Discussed importance of healthy weight management Discussed diet and exercise Reports less walking d/t heat; denies other concerns Associated with HLD, CAD and chronic pain s/p impingement syndrome       Need for shingles vaccine    Consented; provided VIS made available Repeat in 2 months       Relevant Orders   Varicella-zoster vaccine IM (Completed)   Pure hypercholesterolemia    Previous elevated; on lipitor 40 mg Continue to use fish oil as well Last LDL of 68 recommend diet low in saturated fat and regular exercise - 30 min at least 5 times per week HDL previous low despite use of fish oil Recommend  increase in diet of healthier fat choices- low fat meats, oils that are not solid at room temperature, nuts, seeds, fish- cod, halibut, salmon, and avocado. Exercise can also increase this number.  Supplemental omega 3's can be taken as well but are not as helpful as dietary/exercise changes.       Relevant Orders   Comprehensive metabolic panel   Lipid Panel With LDL/HDL Ratio   Screening for prostate cancer    Denies LUTS; check PSA Last checked in 2016      Relevant Orders   PSA     Return in about 8 weeks (around 05/24/2022) for immunization, medication administration, nurse follow up.     Vonna Kotyk, FNP, have reviewed all documentation for this visit. The documentation on 03/29/22 for the exam, diagnosis, procedures, and orders are all accurate and complete.    Gwyneth Sprout, [place] [phone] (phone) [phone] (fax)  [place]

## 2022-03-29 ENCOUNTER — Encounter: Payer: Self-pay | Admitting: Family Medicine

## 2022-03-29 ENCOUNTER — Ambulatory Visit (INDEPENDENT_AMBULATORY_CARE_PROVIDER_SITE_OTHER): Payer: Managed Care, Other (non HMO) | Admitting: Family Medicine

## 2022-03-29 VITALS — BP 113/74 | HR 84 | Temp 98.7°F | Resp 16 | Ht 72.0 in | Wt 292.0 lb

## 2022-03-29 DIAGNOSIS — Z6838 Body mass index (BMI) 38.0-38.9, adult: Secondary | ICD-10-CM

## 2022-03-29 DIAGNOSIS — E7800 Pure hypercholesterolemia, unspecified: Secondary | ICD-10-CM

## 2022-03-29 DIAGNOSIS — G894 Chronic pain syndrome: Secondary | ICD-10-CM | POA: Diagnosis not present

## 2022-03-29 DIAGNOSIS — I2510 Atherosclerotic heart disease of native coronary artery without angina pectoris: Secondary | ICD-10-CM | POA: Diagnosis not present

## 2022-03-29 DIAGNOSIS — Z0000 Encounter for general adult medical examination without abnormal findings: Secondary | ICD-10-CM | POA: Diagnosis not present

## 2022-03-29 DIAGNOSIS — Z23 Encounter for immunization: Secondary | ICD-10-CM | POA: Insufficient documentation

## 2022-03-29 DIAGNOSIS — Z125 Encounter for screening for malignant neoplasm of prostate: Secondary | ICD-10-CM

## 2022-03-29 DIAGNOSIS — K625 Hemorrhage of anus and rectum: Secondary | ICD-10-CM | POA: Insufficient documentation

## 2022-03-29 NOTE — Assessment & Plan Note (Signed)
Chronic, stable Denies use of medication Has elaborate stretching routine to assist given hx of impingement

## 2022-03-29 NOTE — Assessment & Plan Note (Signed)
Denies LUTS; check PSA Last checked in 2016

## 2022-03-29 NOTE — Assessment & Plan Note (Signed)
Chronic, worsening Body mass index is 39.6 kg/m. Discussed importance of healthy weight management Discussed diet and exercise Reports less walking d/t heat; denies other concerns Associated with HLD, CAD and chronic pain s/p impingement syndrome

## 2022-03-29 NOTE — Assessment & Plan Note (Signed)
Previous elevated; on lipitor 40 mg Continue to use fish oil as well Last LDL of 68 recommend diet low in saturated fat and regular exercise - 30 min at least 5 times per week HDL previous low despite use of fish oil Recommend increase in diet of healthier fat choices- low fat meats, oils that are not solid at room temperature, nuts, seeds, fish- cod, halibut, salmon, and avocado. Exercise can also increase this number.  Supplemental omega 3's can be taken as well but are not as helpful as dietary/exercise changes.

## 2022-03-29 NOTE — Assessment & Plan Note (Signed)
Consented; provided VIS made available Repeat in 2 months

## 2022-03-29 NOTE — Assessment & Plan Note (Signed)
Acute, recurrent Denies associated symptoms Reports that symptoms last 1 BM; BRB seen on both TP and in toilet Previously discussed with Dr Allen Norris with GI Continue to monitor symptoms and report if symptoms continue to worsen or become more frequent

## 2022-03-29 NOTE — Assessment & Plan Note (Signed)
Chronic, stable; denies CP or DOE/SOB Continues to avoid meat products; >15 years now BMI remains elevated and has increased in last 6 months Body mass index is 39.6 kg/m.

## 2022-03-29 NOTE — Assessment & Plan Note (Signed)
Due for dental; waiting until January Due for vision; use of magnifying glass at work Things to do to keep yourself healthy  - Exercise at least 30-45 minutes a day, 3-4 days a week.  - Eat a low-fat diet with lots of fruits and vegetables, up to 7-9 servings per day.  - Seatbelts can save your life. Wear them always.  - Smoke detectors on every level of your home, check batteries every year.  - Eye Doctor - have an eye exam every 1-2 years  - Safe sex - if you may be exposed to STDs, use a condom.  - Alcohol -  If you drink, do it moderately, less than 2 drinks per day.  - [place]. Choose someone to speak for you if you are not able.  - Depression is common in our stressful world.If you're feeling down or losing interest in things you normally enjoy, please come in for a visit.  - Violence - If anyone is threatening or hurting you, please call immediately.

## 2022-03-30 LAB — CBC WITH DIFFERENTIAL/PLATELET
Basophils Absolute: 0 10*3/uL (ref 0.0–0.2)
Basos: 1 %
EOS (ABSOLUTE): 0.1 10*3/uL (ref 0.0–0.4)
Eos: 2 %
Hematocrit: 44 % (ref 37.5–51.0)
Hemoglobin: 14.9 g/dL (ref 13.0–17.7)
Immature Grans (Abs): 0 10*3/uL (ref 0.0–0.1)
Immature Granulocytes: 0 %
Lymphocytes Absolute: 1.7 10*3/uL (ref 0.7–3.1)
Lymphs: 27 %
MCH: 31.8 pg (ref 26.6–33.0)
MCHC: 33.9 g/dL (ref 31.5–35.7)
MCV: 94 fL (ref 79–97)
Monocytes Absolute: 0.8 10*3/uL (ref 0.1–0.9)
Monocytes: 13 %
Neutrophils Absolute: 3.6 10*3/uL (ref 1.4–7.0)
Neutrophils: 57 %
Platelets: 238 10*3/uL (ref 150–450)
RBC: 4.68 x10E6/uL (ref 4.14–5.80)
RDW: 12.8 % (ref 11.6–15.4)
WBC: 6.3 10*3/uL (ref 3.4–10.8)

## 2022-03-30 LAB — COMPREHENSIVE METABOLIC PANEL
ALT: 11 IU/L (ref 0–44)
AST: 14 IU/L (ref 0–40)
Albumin/Globulin Ratio: 2.1 (ref 1.2–2.2)
Albumin: 4.2 g/dL (ref 3.9–4.9)
Alkaline Phosphatase: 98 IU/L (ref 44–121)
BUN/Creatinine Ratio: 23 (ref 10–24)
BUN: 17 mg/dL (ref 8–27)
Bilirubin Total: 0.3 mg/dL (ref 0.0–1.2)
CO2: 20 mmol/L (ref 20–29)
Calcium: 9.1 mg/dL (ref 8.6–10.2)
Chloride: 106 mmol/L (ref 96–106)
Creatinine, Ser: 0.75 mg/dL — ABNORMAL LOW (ref 0.76–1.27)
Globulin, Total: 2 g/dL (ref 1.5–4.5)
Glucose: 97 mg/dL (ref 70–99)
Potassium: 4.1 mmol/L (ref 3.5–5.2)
Sodium: 139 mmol/L (ref 134–144)
Total Protein: 6.2 g/dL (ref 6.0–8.5)
eGFR: 101 mL/min/{1.73_m2} (ref >59)

## 2022-03-30 LAB — LIPID PANEL WITH LDL/HDL RATIO
Cholesterol, Total: 117 mg/dL (ref 100–199)
HDL: 32 mg/dL — ABNORMAL LOW (ref >39)
LDL Chol Calc (NIH): 69 mg/dL (ref 0–99)
LDL/HDL Ratio: 2.2 ratio (ref 0.0–3.6)
Triglycerides: 78 mg/dL (ref 0–149)
VLDL Cholesterol Cal: 16 mg/dL (ref 5–40)

## 2022-03-30 LAB — PSA: Prostate Specific Ag, Serum: 0.8 ng/mL (ref 0.0–4.0)

## 2022-03-30 NOTE — Progress Notes (Signed)
Low good/HDL cholesterol remains. Recommend increase in diet of healthier fat choices- low fat meats, oils that are not solid at room temperature, nuts, seeds, fish- cod, halibut, salmon, and avocado. Exercise can also increase this number.  Supplemental omega 3's can be taken as well but are not as helpful as dietary/exercise changes.  All other labs normal/stable.  Caleb Lopez, [address] [place], [place] [address] [phone] (phone) [phone] (fax) [place]

## 2022-05-31 ENCOUNTER — Ambulatory Visit (INDEPENDENT_AMBULATORY_CARE_PROVIDER_SITE_OTHER): Payer: Managed Care, Other (non HMO) | Admitting: Family Medicine

## 2022-05-31 DIAGNOSIS — Z23 Encounter for immunization: Secondary | ICD-10-CM

## 2022-08-08 ENCOUNTER — Telehealth: Payer: Self-pay | Admitting: Family Medicine

## 2022-08-08 NOTE — Telephone Encounter (Signed)
[facility] faxed refill request for the following medications:   atorvastatin (LIPITOR) 40 MG tablet    Please advise

## 2022-08-10 ENCOUNTER — Other Ambulatory Visit: Payer: Self-pay | Admitting: Physician Assistant

## 2022-08-10 DIAGNOSIS — E7800 Pure hypercholesterolemia, unspecified: Secondary | ICD-10-CM

## 2022-08-10 MED ORDER — ATORVASTATIN CALCIUM 40 MG PO TABS: 40.0000 mg | ORAL_TABLET | Freq: Every day | ORAL | 3 refills | Status: DC

## 2022-08-14 ENCOUNTER — Ambulatory Visit: Payer: Self-pay | Admitting: *Deleted

## 2022-08-14 NOTE — Telephone Encounter (Signed)
Per [facility]: Drug interactions: none listed for atorvastatin and Meloxicam   Attempted to call patient- left message on VM to call office ( third attempt)

## 2022-08-14 NOTE — Telephone Encounter (Signed)
2nd call attempted  [phone] to contact patient regarding medication questions. No answer, LMVTCB [phone].

## 2022-08-14 NOTE — Telephone Encounter (Signed)
Summary: rx concern / contact req   The patient would like to know if it is okay for them to take atorvastatin (LIPITOR) 40 MG tablet [[id number]] and Meloxicam simultaneously  The patient has recently been prescribed Meloxicam by another provider for their neck discomfort  The patient would like to speak with a member of clinical staff about this further when possible      Called patient on [phone] to review medication questions. No answer, LVMTCB [phone].

## 2023-01-31 ENCOUNTER — Ambulatory Visit: Payer: Managed Care, Other (non HMO) | Admitting: Dermatology

## 2023-01-31 ENCOUNTER — Encounter: Payer: Self-pay | Admitting: Dermatology

## 2023-01-31 VITALS — BP 119/74 | HR 79

## 2023-01-31 DIAGNOSIS — W908XXA Exposure to other nonionizing radiation, initial encounter: Secondary | ICD-10-CM | POA: Diagnosis not present

## 2023-01-31 DIAGNOSIS — L578 Other skin changes due to chronic exposure to nonionizing radiation: Secondary | ICD-10-CM | POA: Diagnosis not present

## 2023-01-31 DIAGNOSIS — D1801 Hemangioma of skin and subcutaneous tissue: Secondary | ICD-10-CM

## 2023-01-31 DIAGNOSIS — B353 Tinea pedis: Secondary | ICD-10-CM

## 2023-01-31 DIAGNOSIS — Z1283 Encounter for screening for malignant neoplasm of skin: Secondary | ICD-10-CM | POA: Diagnosis not present

## 2023-01-31 DIAGNOSIS — Z79899 Other long term (current) drug therapy: Secondary | ICD-10-CM

## 2023-01-31 DIAGNOSIS — L820 Inflamed seborrheic keratosis: Secondary | ICD-10-CM | POA: Diagnosis not present

## 2023-01-31 DIAGNOSIS — L570 Actinic keratosis: Secondary | ICD-10-CM | POA: Diagnosis not present

## 2023-01-31 DIAGNOSIS — D229 Melanocytic nevi, unspecified: Secondary | ICD-10-CM

## 2023-01-31 DIAGNOSIS — L821 Other seborrheic keratosis: Secondary | ICD-10-CM

## 2023-01-31 DIAGNOSIS — L814 Other melanin hyperpigmentation: Secondary | ICD-10-CM

## 2023-01-31 DIAGNOSIS — Z7189 Other specified counseling: Secondary | ICD-10-CM

## 2023-01-31 DIAGNOSIS — X32XXXA Exposure to sunlight, initial encounter: Secondary | ICD-10-CM

## 2023-01-31 MED ORDER — KETOCONAZOLE 2 % EX CREA: TOPICAL_CREAM | CUTANEOUS | 11 refills | Status: DC

## 2023-01-31 NOTE — Progress Notes (Signed)
Follow-Up Visit   Subjective  Caleb Lopez is a 62 y.o. male who presents for the following: Skin Cancer Screening and Full Body Skin Exam. 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 The patient presents for Total-Body Skin Exam (TBSE) for skin cancer screening and mole check. The patient has spots, moles and lesions to be evaluated, some may be new or changing and the patient has concerns that these could be cancer.  The following portions of the chart were reviewed this encounter and updated as appropriate: medications, allergies, medical history  Review of Systems:  No other skin or systemic complaints except as noted in HPI or Assessment and Plan.  Objective  Well appearing patient in no apparent distress; mood and affect are within normal limits.  A full examination was performed including scalp, head, eyes, ears, nose, lips, neck, chest, axillae, abdomen, back, buttocks, bilateral upper extremities, bilateral lower extremities, hands, feet, fingers, toes, fingernails, and toenails. All findings within normal limits unless otherwise noted below.   Relevant physical exam findings are noted in the Assessment and Plan.  right lower eyelid margin x1, face x22, arms x3, left posterior thigh x1, L anterior thigh x1, R calf x1, right heel x1, R thigh x1, L leg x1 (32) Erythematous keratotic or waxy stuck-on papule or plaque.  Forehead x1 Erythematous thin papules/macules with gritty scale.    Assessment & Plan   LENTIGINES, SEBORRHEIC KERATOSES, HEMANGIOMAS - Benign normal skin lesions - Benign-appearing - Call for any changes  MELANOCYTIC NEVI - Tan-brown and/or pink-flesh-colored symmetric macules and papules - Benign appearing on exam today - Observation - Call clinic for new or changing moles - Recommend daily use of broad spectrum spf 30+ sunscreen to sun-exposed areas.   ACTINIC DAMAGE - Chronic condition, secondary to  cumulative UV/sun exposure - diffuse scaly erythematous macules with underlying dyspigmentation - Recommend daily broad spectrum sunscreen SPF 30+ to sun-exposed areas, reapply every 2 hours as needed.  - Staying in the shade or wearing long sleeves, sun glasses (UVA+UVB protection) and wide brim hats (4-inch brim around the entire circumference of the hat) are also recommended for sun protection.  - Call for new or changing lesions.  SKIN CANCER SCREENING PERFORMED TODAY.  STUCCO KERATOSES - Stuck-on, keratotic papules and/or plaques  - Benign-appearing - Discussed benign etiology and prognosis. - Observe - Call for any changes  TINEA PEDIS Exam: Scaling and maceration web spaces and over distal and lateral soles. Chronic and persistent condition with duration or expected duration over one year. Condition is symptomatic / bothersome to patient. Not to goal. Treatment Plan: Ketoconazole 2% cream apply to feet every night at bedtime.  Inflamed seborrheic keratosis (32) right lower eyelid margin x1, face x22, arms x3, left posterior thigh x1, L anterior thigh x1, R calf x1, right heel x1, R thigh x1, L leg x1  Symptomatic, irritating, patient would like treated.  Destruction of lesion - right lower eyelid margin x1, face x22, arms x3, left posterior thigh x1, L anterior thigh x1, R calf x1, right heel x1, R thigh x1, L leg x1 Complexity: simple   Destruction method: cryotherapy   Informed consent: discussed and consent obtained   Timeout:  patient name, date of birth, surgical site, and procedure verified Lesion destroyed using liquid nitrogen: Yes   Region frozen until ice ball extended beyond lesion: Yes   Outcome: patient tolerated procedure well with no complications   Post-procedure  details: wound care instructions given   Additional details:  Prior to procedure, discussed risks of blister formation, small wound, skin dyspigmentation, or rare scar following cryotherapy.  Recommend Vaseline ointment to treated areas while healing.   AK (actinic keratosis) Forehead x1  Actinic keratoses are precancerous spots that appear secondary to cumulative UV radiation exposure/sun exposure over time. They are chronic with expected duration over 1 year. A portion of actinic keratoses will progress to squamous cell carcinoma of the skin. It is not possible to reliably predict which spots will progress to skin cancer and so treatment is recommended to prevent development of skin cancer.  Recommend daily broad spectrum sunscreen SPF 30+ to sun-exposed areas, reapply every 2 hours as needed.  Recommend staying in the shade or wearing long sleeves, sun glasses (UVA+UVB protection) and wide brim hats (4-inch brim around the entire circumference of the hat). Call for new or changing lesions.  Destruction of lesion - Forehead x1 Complexity: simple   Destruction method: cryotherapy   Informed consent: discussed and consent obtained   Timeout:  patient name, date of birth, surgical site, and procedure verified Lesion destroyed using liquid nitrogen: Yes   Region frozen until ice ball extended beyond lesion: Yes   Outcome: patient tolerated procedure well with no complications   Post-procedure details: wound care instructions given   Additional details:  Prior to procedure, discussed risks of blister formation, small wound, skin dyspigmentation, or rare scar following cryotherapy. Recommend Vaseline ointment to treated areas while healing.   Tinea pedis of right foot  Related Medications ketoconazole (NIZORAL) 2 % cream Apply to feet every night at bedtime   Return in about 1 year (around 01/31/2024) for TBSE.  I, Lawson Radar, CMA, am acting as scribe for [name], MD.  Documentation: I have reviewed the above documentation for accuracy and completeness, and I agree with the above.  [name], MD

## 2023-01-31 NOTE — Patient Instructions (Addendum)
Cryotherapy Aftercare  Wash gently with soap and water everyday.   Apply Vaseline daily until healed.    Recommend daily broad spectrum sunscreen SPF 30+ to sun-exposed areas, reapply every 2 hours as needed. Call for new or changing lesions.  Staying in the shade or wearing long sleeves, sun glasses (UVA+UVB protection) and wide brim hats (4-inch brim around the entire circumference of the hat) are also recommended for sun protection.     Melanoma ABCDEs  Melanoma is the most dangerous type of skin cancer, and is the leading cause of death from skin disease.  You are more likely to develop melanoma if you: Have light-colored skin, light-colored eyes, or red or blond hair Spend a lot of time in the sun Tan regularly, either outdoors or in a tanning bed Have had blistering sunburns, especially during childhood Have a close family member who has had a melanoma Have atypical moles or large birthmarks  Early detection of melanoma is key since treatment is typically straightforward and cure rates are extremely high if we catch it early.   The first sign of melanoma is often a change in a mole or a new dark spot.  The ABCDE system is a way of remembering the signs of melanoma.  A for asymmetry:  The two halves do not match. B for border:  The edges of the growth are irregular. C for color:  A mixture of colors are present instead of an even brown color. D for diameter:  Melanomas are usually (but not always) greater than 6mm - the size of a pencil eraser. E for evolution:  The spot keeps changing in size, shape, and color.  Please check your skin once per month between visits. You can use a small mirror in front and a large mirror behind you to keep an eye on the back side or your body.   If you see any new or changing lesions before your next follow-up, please call to schedule a visit.  Please continue daily skin protection including broad spectrum sunscreen SPF 30+ to sun-exposed  areas, reapplying every 2 hours as needed when you're outdoors.   Staying in the shade or wearing long sleeves, sun glasses (UVA+UVB protection) and wide brim hats (4-inch brim around the entire circumference of the hat) are also recommended for sun protection.     Due to recent changes in healthcare laws, you may see results of your pathology and/or laboratory studies on MyChart before the doctors have had a chance to review them. We understand that in some cases there may be results that are confusing or concerning to you. Please understand that not all results are received at the same time and often the doctors may need to interpret multiple results in order to provide you with the best plan of care or course of treatment. Therefore, we ask that you please give us 2 business days to thoroughly review all your results before contacting the office for clarification. Should we see a critical lab result, you will be contacted sooner.   If You Need Anything After Your Visit  If you have any questions or concerns for your doctor, please call our main line at [phone] and press option 4 to reach your doctor's medical assistant. If no one answers, please leave a voicemail as directed and we will return your call as soon as possible. Messages left after 4 pm will be answered the following business day.   You may also send us a message via   MyChart. We typically respond to MyChart messages within 1-2 business days.  For prescription refills, please ask your pharmacy to contact our office. Our fax number is [phone].  If you have an urgent issue when the clinic is closed that cannot wait until the next business day, you can page your doctor at the number below.    Please note that while we do our best to be available for urgent issues outside of office hours, we are not available 24/7.   If you have an urgent issue and are unable to reach us, you may choose to seek medical care at your doctor's  office, retail clinic, urgent care center, or emergency room.  If you have a medical emergency, please immediately call 911 or go to the emergency department.  Pager Numbers  - Dr. Kowalski: [phone]  - Dr. Moye: [phone]  - Dr. Stewart: [phone]  In the event of inclement weather, please call our main line at [phone] for an update on the status of any delays or closures.  Dermatology Medication Tips: Please keep the boxes that topical medications come in in order to help keep track of the instructions about where and how to use these. Pharmacies typically print the medication instructions only on the boxes and not directly on the medication tubes.   If your medication is too expensive, please contact our office at [phone] option 4 or send us a message through MyChart.   We are unable to tell what your co-pay for medications will be in advance as this is different depending on your insurance coverage. However, we may be able to find a substitute medication at lower cost or fill out paperwork to get insurance to cover a needed medication.   If a prior authorization is required to get your medication covered by your insurance company, please allow us 1-2 business days to complete this process.  Drug prices often vary depending on where the prescription is filled and some pharmacies may offer cheaper prices.  The website www.goodrx.com contains coupons for medications through different pharmacies. The prices here do not account for what the cost may be with help from insurance (it may be cheaper with your insurance), but the website can give you the price if you did not use any insurance.  - You can print the associated coupon and take it with your prescription to the pharmacy.  - You may also stop by our office during regular business hours and pick up a GoodRx coupon card.  - If you need your prescription sent electronically to a different pharmacy, notify our office  through [facility] MyChart or by phone at [phone] option 4.     Si Usted Necesita Algo Despus de Su Visita  Tambin puede enviarnos un mensaje a travs de MyChart. Por lo general respondemos a los mensajes de MyChart en el transcurso de 1 a 2 das hbiles.  Para renovar recetas, por favor pida a su farmacia que se ponga en contacto con nuestra oficina. Nuestro nmero de fax es el [phone].  Si tiene un asunto urgente cuando la clnica est cerrada y que no puede esperar hasta el siguiente da hbil, puede llamar/localizar a su doctor(a) al nmero que aparece a continuacin.   Por favor, tenga en cuenta que aunque hacemos todo lo posible para estar disponibles para asuntos urgentes fuera del horario de oficina, no estamos disponibles las 24 horas del da, los 7 das de la semana.   Si tiene un problema urgente y no   puede comunicarse con nosotros, puede optar por buscar atencin mdica  en el consultorio de su doctor(a), en una clnica privada, en un centro de atencin urgente o en una sala de emergencias.  Si tiene una emergencia mdica, por favor llame inmediatamente al 911 o vaya a la sala de emergencias.  Nmeros de bper  - Dr. Kowalski: [phone]  - Dra. Moye: [phone]  - Dra. Stewart: [phone]  En caso de inclemencias del tiempo, por favor llame a nuestra lnea principal al [phone] para una actualizacin sobre el estado de cualquier retraso o cierre.  Consejos para la medicacin en dermatologa: Por favor, guarde las cajas en las que vienen los medicamentos de uso tpico para ayudarle a seguir las instrucciones sobre dnde y cmo usarlos. Las farmacias generalmente imprimen las instrucciones del medicamento slo en las cajas y no directamente en los tubos del medicamento.   Si su medicamento es muy caro, por favor, pngase en contacto con nuestra oficina llamando al [phone] y presione la opcin 4 o envenos un mensaje a travs de MyChart.   No  podemos decirle cul ser su copago por los medicamentos por adelantado ya que esto es diferente dependiendo de la cobertura de su seguro. Sin embargo, es posible que podamos encontrar un medicamento sustituto a menor costo o llenar un formulario para que el seguro cubra el medicamento que se considera necesario.   Si se requiere una autorizacin previa para que su compaa de seguros cubra su medicamento, por favor permtanos de 1 a 2 das hbiles para completar este proceso.  Los precios de los medicamentos varan con frecuencia dependiendo del lugar de dnde se surte la receta y alguna farmacias pueden ofrecer precios ms baratos.  El sitio web www.goodrx.com tiene cupones para medicamentos de diferentes farmacias. Los precios aqu no tienen en cuenta lo que podra costar con la ayuda del seguro (puede ser ms barato con su seguro), pero el sitio web puede darle el precio si no utiliz ningn seguro.  - Puede imprimir el cupn correspondiente y llevarlo con su receta a la farmacia.  - Tambin puede pasar por nuestra oficina durante el horario de atencin regular y recoger una tarjeta de cupones de GoodRx.  - Si necesita que su receta se enve electrnicamente a una farmacia diferente, informe a nuestra oficina a travs de MyChart de [place] o por telfono llamando al [phone] y presione la opcin 4.    

## 2023-02-13 ENCOUNTER — Encounter: Payer: Self-pay | Admitting: Dermatology

## 2023-05-22 ENCOUNTER — Encounter: Payer: Managed Care, Other (non HMO) | Admitting: Family Medicine

## 2023-06-05 ENCOUNTER — Ambulatory Visit (INDEPENDENT_AMBULATORY_CARE_PROVIDER_SITE_OTHER): Payer: Managed Care, Other (non HMO) | Admitting: Family Medicine

## 2023-06-05 ENCOUNTER — Encounter: Payer: Self-pay | Admitting: Family Medicine

## 2023-06-05 VITALS — BP 107/64 | HR 68 | Ht 72.0 in | Wt 262.8 lb

## 2023-06-05 DIAGNOSIS — I2510 Atherosclerotic heart disease of native coronary artery without angina pectoris: Secondary | ICD-10-CM | POA: Diagnosis not present

## 2023-06-05 DIAGNOSIS — Z23 Encounter for immunization: Secondary | ICD-10-CM

## 2023-06-05 DIAGNOSIS — Z0000 Encounter for general adult medical examination without abnormal findings: Secondary | ICD-10-CM

## 2023-06-05 DIAGNOSIS — N401 Enlarged prostate with lower urinary tract symptoms: Secondary | ICD-10-CM | POA: Insufficient documentation

## 2023-06-05 DIAGNOSIS — Z125 Encounter for screening for malignant neoplasm of prostate: Secondary | ICD-10-CM

## 2023-06-05 DIAGNOSIS — R7303 Prediabetes: Secondary | ICD-10-CM | POA: Insufficient documentation

## 2023-06-05 DIAGNOSIS — R7309 Other abnormal glucose: Secondary | ICD-10-CM | POA: Insufficient documentation

## 2023-06-05 DIAGNOSIS — R351 Nocturia: Secondary | ICD-10-CM

## 2023-06-05 DIAGNOSIS — Z0001 Encounter for general adult medical examination with abnormal findings: Secondary | ICD-10-CM

## 2023-06-05 DIAGNOSIS — E7800 Pure hypercholesterolemia, unspecified: Secondary | ICD-10-CM

## 2023-06-05 NOTE — Assessment & Plan Note (Signed)
Endorses nocturia as his LUTS; recommend PSA in place of DRE. If PSA is elevated for age, we will repeat; if PSA remains elevated pt will be referred to urology for DRE and next steps for best treatment.

## 2023-06-05 NOTE — Assessment & Plan Note (Signed)
Chronic, repeat LP  recommend diet low in saturated fat and regular exercise - 30 min at least 5 times per week Remains on 1000 mg Krill Oil in addition to Lipitor 40 mg

## 2023-06-05 NOTE — Assessment & Plan Note (Signed)
Upcoming dental work; denies vision or hearing concerns Things to do to keep yourself healthy  - Exercise at least 30-45 minutes a day, 3-4 days a week.  - Eat a low-fat diet with lots of fruits and vegetables, up to 7-9 servings per day.  - Seatbelts can save your life. Wear them always.  - Smoke detectors on every level of your home, check batteries every year.  - Eye Doctor - have an eye exam every 1-2 years  - Safe sex - if you may be exposed to STDs, use a condom.  - Alcohol -  If you drink, do it moderately, less than 2 drinks per day.  - Health Care Power of Attorney. Choose someone to speak for you if you are not able.  - Depression is common in our stressful world.If you're feeling down or losing interest in things you normally enjoy, please come in for a visit.  - Violence - If anyone is threatening or hurting you, please call immediately.

## 2023-06-05 NOTE — Assessment & Plan Note (Signed)
Chronic, repeat A1c 30# weight loss noted since last CPE 7/23

## 2023-06-05 NOTE — Assessment & Plan Note (Signed)
Chronic, improved Body mass index is 35.64 kg/m. Continue to recommend balanced, lower carb meals. Smaller meal size, adding snacks. Choosing water as drink of choice and increasing purposeful exercise. In association with HLD/CAD and borderline diabetes

## 2023-06-05 NOTE — Progress Notes (Signed)
Complete physical exam  Patient: Caleb Lopez   DOB: 10/03/1960   62 y.o. Male  MRN: [id number] Visit Date: 06/05/2023  Today's healthcare provider: Jacky Kindle, FNP  Re-introduced to nurse practitioner role and practice setting.  All questions answered.  Discussed provider/patient relationship and expectations.  Chief Complaint  Patient presents with   Annual Exam    Diet- NO meat in 19 years, only veggies, salt free nuts, olive oil and low sodium Exercise - walking when weather allows for at least 30 minutes  Feeling - well Sleeping - well Concerns - none   Subjective    Caleb Lopez is a 62 y.o. male who presents today for a complete physical exam.  He reports consuming a  vegetarian  diet.  Walking for exercise.  He generally feels well. He reports sleeping well. He does not have additional problems to discuss today.   HPI HPI     Annual Exam    Additional comments: Diet- NO meat in 19 years, only veggies, salt free nuts, olive oil and low sodium Exercise - walking when weather allows for at least 30 minutes  Feeling - well Sleeping - well Concerns - none      Last edited by Acey Lav, CMA on 06/05/2023  1:52 PM.      History reviewed. No pertinent past medical history. Past Surgical History:  Procedure Laterality Date   COLONOSCOPY WITH PROPOFOL N/A 06/03/2019   Procedure: COLONOSCOPY WITH PROPOFOL;  Surgeon: Midge Minium, MD;  Location: [facility] ENDOSCOPY;  Service: Endoscopy;  Laterality: N/A;   THORACENTESIS  1980'S   x's 2 probably infection   TONSILLECTOMY AND ADENOIDECTOMY  1960'S   Social History   Socioeconomic History   Marital status: Married    Spouse name: Not on file   Number of children: Not on file   Years of education: Not on file   Highest education level: Not on file  Occupational History   Not on file  Tobacco Use   Smoking status: Former    Current packs/day: 0.00    Types: Cigarettes    Quit date: 09/11/1988    Years  since quitting: 34.7   Smokeless tobacco: Never  Vaping Use   Vaping status: Never Used  Substance and Sexual Activity   Alcohol use: Yes    Comment: occasional   Drug use: Never   Sexual activity: Not on file  Other Topics Concern   Not on file  Social History Narrative   Not on file   Social Determinants of Health   Financial Resource Strain: Not on file  Food Insecurity: Not on file  Transportation Needs: Not on file  Physical Activity: Not on file  Stress: Not on file  Social Connections: Not on file  Intimate Partner Violence: Not on file   Family Status  Relation Name Status   Mother  Alive   Father  Alive   Sister  Alive       pituitary adenoma  No partnership data on file   Family History  Problem Relation Age of Onset   Diverticulitis Mother    Arthritis Mother    Glaucoma Mother    Diabetes Father    No Known Allergies  Patient Care Team: Merita Norton T, FNP as PCP - General (Family Medicine)   Medications: Outpatient Medications Prior to Visit  Medication Sig   atorvastatin (LIPITOR) 40 MG tablet Take 1 tablet (40 mg total) by mouth daily.  Krill Oil 500 MG CAPS Take 2 capsules by mouth daily.   [DISCONTINUED] co-enzyme Q-10 30 MG capsule Take 200 mg by mouth daily. (Patient not taking: Reported on 03/29/2022)   [DISCONTINUED] ketoconazole (NIZORAL) 2 % cream Apply to feet every night at bedtime (Patient not taking: Reported on 06/05/2023)   No facility-administered medications prior to visit.    Objective    BP 107/64 (BP Location: Right Arm, Patient Position: Sitting, Cuff Size: Large)   Pulse 68   Ht 6' (1.829 m)   Wt 262 lb 12.8 oz (119.2 kg)   SpO2 98%   BMI 35.64 kg/m   Physical Exam Vitals and nursing note reviewed.  Constitutional:      General: He is awake. He is not in acute distress.    Appearance: Normal appearance. He is well-developed and well-groomed. He is obese. He is not ill-appearing, toxic-appearing or diaphoretic.   HENT:     Head: Normocephalic and atraumatic.     Jaw: There is normal jaw occlusion. No trismus, tenderness, swelling or pain on movement.     Salivary Glands: Right salivary gland is not diffusely enlarged or tender. Left salivary gland is not diffusely enlarged or tender.     Right Ear: Hearing, tympanic membrane, ear canal and external ear normal. There is no impacted cerumen.     Left Ear: Hearing, tympanic membrane, ear canal and external ear normal. There is no impacted cerumen.     Nose: Nose normal. No congestion or rhinorrhea.     Right Turbinates: Not enlarged, swollen or pale.     Left Turbinates: Not enlarged, swollen or pale.     Right Sinus: No maxillary sinus tenderness or frontal sinus tenderness.     Left Sinus: No maxillary sinus tenderness or frontal sinus tenderness.     Mouth/Throat:     Lips: Pink.     Mouth: Mucous membranes are moist. No injury, lacerations, oral lesions or angioedema.     Pharynx: Oropharynx is clear. Uvula midline. No pharyngeal swelling, oropharyngeal exudate or posterior oropharyngeal erythema.     Tonsils: No tonsillar exudate or tonsillar abscesses.  Eyes:     General: Lids are normal. Vision grossly intact. Gaze aligned appropriately.        Right eye: No discharge.        Left eye: No discharge.     Extraocular Movements: Extraocular movements intact.     Conjunctiva/sclera: Conjunctivae normal.     Pupils: Pupils are equal, round, and reactive to light.  Neck:     Thyroid: No thyroid mass, thyromegaly or thyroid tenderness.     Vascular: No carotid bruit.     Trachea: Trachea normal. No tracheal tenderness.  Cardiovascular:     Rate and Rhythm: Normal rate and regular rhythm.     Pulses: Normal pulses.          Carotid pulses are 2+ on the right side and 2+ on the left side.      Radial pulses are 2+ on the right side and 2+ on the left side.       Femoral pulses are 2+ on the right side and 2+ on the left side.      Popliteal  pulses are 2+ on the right side and 2+ on the left side.       Dorsalis pedis pulses are 2+ on the right side and 2+ on the left side.       Posterior tibial pulses are 2+ on  the right side and 2+ on the left side.     Heart sounds: Normal heart sounds, S1 normal and S2 normal. No murmur heard.    No friction rub. No gallop.  Pulmonary:     Effort: Pulmonary effort is normal. No respiratory distress.     Breath sounds: Normal breath sounds and air entry. No stridor. No wheezing, rhonchi or rales.  Chest:     Chest wall: No tenderness.  Abdominal:     General: Abdomen is flat. Bowel sounds are normal. There is no distension.     Palpations: Abdomen is soft. There is no mass.     Tenderness: There is no abdominal tenderness. There is no guarding or rebound.     Hernia: No hernia is present.  Genitourinary:    Comments: Exam deferred; use of "pee bottle" to assist with complaints of nocturia  Musculoskeletal:        General: No swelling, tenderness, deformity or signs of injury. Normal range of motion.     Cervical back: Normal range of motion and neck supple. No rigidity or tenderness.     Right lower leg: No edema.     Left lower leg: No edema.  Lymphadenopathy:     Cervical: No cervical adenopathy.     Right cervical: No superficial, deep or posterior cervical adenopathy.    Left cervical: No superficial, deep or posterior cervical adenopathy.  Skin:    General: Skin is warm and dry.     Capillary Refill: Capillary refill takes less than 2 seconds.     Coloration: Skin is not jaundiced or pale.     Findings: No bruising, erythema, lesion or rash.  Neurological:     General: No focal deficit present.     Mental Status: He is alert and oriented to person, place, and time. Mental status is at baseline.     GCS: GCS eye subscore is 4. GCS verbal subscore is 5. GCS motor subscore is 6.     Sensory: Sensation is intact. No sensory deficit.     Motor: Motor function is intact. No  weakness.     Coordination: Coordination is intact.     Gait: Gait is intact.  Psychiatric:        Attention and Perception: Attention and perception normal.        Mood and Affect: Mood and affect normal.        Speech: Speech normal.        Behavior: Behavior normal. Behavior is cooperative.        Thought Content: Thought content normal.        Cognition and Memory: Cognition normal.        Judgment: Judgment normal.    Last depression screening scores    06/05/2023    1:52 PM 03/29/2022    9:22 AM 06/21/2020    1:29 PM  PHQ 2/9 Scores  PHQ - 2 Score 0 0 0  PHQ- 9 Score  0    Last fall risk screening    06/05/2023    1:52 PM  Fall Risk   Falls in the past year? 0  Injury with Fall? 0  Risk for fall due to : No Fall Risks  Follow up Falls evaluation completed   Last Audit-C alcohol use screening    03/29/2022    9:22 AM  Alcohol Use Disorder Test (AUDIT)  1. How often do you have a drink containing alcohol? 0  2. How many drinks containing  alcohol do you have on a typical day when you are drinking? 0  3. How often do you have six or more drinks on one occasion? 0  AUDIT-C Score 0   A score of 3 or more in women, and 4 or more in men indicates increased risk for alcohol abuse, EXCEPT if all of the points are from question 1   No results found for any visits on 06/05/23.  Assessment & Plan    Routine Health Maintenance and Physical Exam  Exercise Activities and Dietary recommendations  Goals   None     Immunization History  Administered Date(s) Administered   Influenza, Seasonal, Injecte, Preservative Fre 06/05/2023   Influenza,inj,Quad PF,6+ Mos 07/30/2014, 07/04/2017, 06/06/2018, 07/11/2019, 06/21/2020, 08/08/2021, 05/31/2022   Tdap 04/29/2012   Zoster Recombinant(Shingrix) 03/29/2022, 05/31/2022   Zoster, Live 04/29/2012    Health Maintenance  Topic Date Due   DTaP/Tdap/Td (2 - Td or Tdap) 04/29/2022   Colonoscopy  06/02/2024   INFLUENZA VACCINE   Completed   Hepatitis C Screening  Completed   HIV Screening  Completed   Zoster Vaccines- Shingrix  Completed   HPV VACCINES  Aged Out   COVID-19 Vaccine  Discontinued    Discussed health benefits of physical activity, and encouraged him to engage in regular exercise appropriate for his age and condition.  Problem List Items Addressed This Visit       Cardiovascular and Mediastinum   Coronary artery disease    Chronic, repeat LP  recommend diet low in saturated fat and regular exercise - 30 min at least 5 times per week Remains on 1000 mg Krill Oil in addition to Lipitor 40 mg       Relevant Orders   Lipid Panel With LDL/HDL Ratio     Other   Annual physical exam - Primary    Upcoming dental work; denies vision or hearing concerns Things to do to keep yourself healthy  - Exercise at least 30-45 minutes a day, 3-4 days a week.  - Eat a low-fat diet with lots of fruits and vegetables, up to 7-9 servings per day.  - Seatbelts can save your life. Wear them always.  - Smoke detectors on every level of your home, check batteries every year.  - Eye Doctor - have an eye exam every 1-2 years  - Safe sex - if you may be exposed to STDs, use a condom.  - Alcohol -  If you drink, do it moderately, less than 2 drinks per day.  - Health Care Power of Attorney. Choose someone to speak for you if you are not able.  - Depression is common in our stressful world.If you're feeling down or losing interest in things you normally enjoy, please come in for a visit.  - Violence - If anyone is threatening or hurting you, please call immediately.       Borderline diabetes    Chronic, repeat A1c 30# weight loss noted since last CPE 7/23      Relevant Orders   Hemoglobin A1c   BPH associated with nocturia    Chronic, stable Repeat PSA; defer DRE at this time Stable with use of urinal to assist with nocturia       Relevant Orders   PSA   Morbid obesity (HCC)    Chronic, improved Body  mass index is 35.64 kg/m. Continue to recommend balanced, lower carb meals. Smaller meal size, adding snacks. Choosing water as drink of choice and increasing purposeful exercise. In  association with HLD/CAD and borderline diabetes       Relevant Orders   CBC with Differential/Platelet   Comprehensive metabolic panel   Lipid Panel With LDL/HDL Ratio   TSH   Hemoglobin A1c   Pure hypercholesterolemia    Chronic, repeat LP  recommend diet low in saturated fat and regular exercise - 30 min at least 5 times per week Remains on 1000 mg Krill Oil in addition to Lipitor 40 mg       Relevant Orders   Comprehensive metabolic panel   Lipid Panel With LDL/HDL Ratio   Screening for prostate cancer    Endorses nocturia as his LUTS; recommend PSA in place of DRE. If PSA is elevated for age, we will repeat; if PSA remains elevated pt will be referred to urology for DRE and next steps for best treatment.       Relevant Orders   PSA   Other Visit Diagnoses     Immunization due       Relevant Orders   Flu vaccine trivalent PF, 6mos and older(Flulaval,Afluria,Fluarix,Fluzone) (Completed)   Influenza vaccine needed       Relevant Orders   Flu vaccine trivalent PF, 6mos and older(Flulaval,Afluria,Fluarix,Fluzone) (Completed)      Return in about 1 year (around 06/04/2024), or if symptoms worsen or fail to improve.    Leilani Merl, FNP, have reviewed all documentation for this visit. The documentation on 06/05/23 for the exam, diagnosis, procedures, and orders are all accurate and complete.  Jacky Kindle, FNP  [facility] [phone] (phone) [phone] (fax)  [facility]

## 2023-06-05 NOTE — Assessment & Plan Note (Signed)
Chronic, stable Repeat PSA; defer DRE at this time Stable with use of urinal to assist with nocturia

## 2023-06-06 ENCOUNTER — Telehealth: Payer: Self-pay

## 2023-06-06 LAB — HEMOGLOBIN A1C
Est. average glucose Bld gHb Est-mCnc: 117 mg/dL
Hgb A1c MFr Bld: 5.7 % — ABNORMAL HIGH (ref 4.8–5.6)

## 2023-06-06 LAB — COMPREHENSIVE METABOLIC PANEL
ALT: 25 IU/L (ref 0–44)
AST: 14 IU/L (ref 0–40)
Albumin: 4.2 g/dL (ref 3.9–4.9)
Alkaline Phosphatase: 102 IU/L (ref 44–121)
BUN/Creatinine Ratio: 17 (ref 10–24)
BUN: 13 mg/dL (ref 8–27)
Bilirubin Total: 0.3 mg/dL (ref 0.0–1.2)
CO2: 20 mmol/L (ref 20–29)
Calcium: 9.6 mg/dL (ref 8.6–10.2)
Chloride: 107 mmol/L — ABNORMAL HIGH (ref 96–106)
Creatinine, Ser: 0.76 mg/dL (ref 0.76–1.27)
Globulin, Total: 2.2 g/dL (ref 1.5–4.5)
Glucose: 103 mg/dL — ABNORMAL HIGH (ref 70–99)
Potassium: 4.8 mmol/L (ref 3.5–5.2)
Sodium: 139 mmol/L (ref 134–144)
Total Protein: 6.4 g/dL (ref 6.0–8.5)
eGFR: 100 mL/min/{1.73_m2} (ref >59)

## 2023-06-06 LAB — CBC WITH DIFFERENTIAL/PLATELET
Basophils Absolute: 0 10*3/uL (ref 0.0–0.2)
Basos: 1 %
EOS (ABSOLUTE): 0.2 10*3/uL (ref 0.0–0.4)
Eos: 2 %
Hematocrit: 45.1 % (ref 37.5–51.0)
Hemoglobin: 14.9 g/dL (ref 13.0–17.7)
Immature Grans (Abs): 0.1 10*3/uL (ref 0.0–0.1)
Immature Granulocytes: 1 %
Lymphocytes Absolute: 1.8 10*3/uL (ref 0.7–3.1)
Lymphs: 25 %
MCH: 31.6 pg (ref 26.6–33.0)
MCHC: 33 g/dL (ref 31.5–35.7)
MCV: 96 fL (ref 79–97)
Monocytes Absolute: 0.7 10*3/uL (ref 0.1–0.9)
Monocytes: 10 %
Neutrophils Absolute: 4.2 10*3/uL (ref 1.4–7.0)
Neutrophils: 61 %
Platelets: 288 10*3/uL (ref 150–450)
RBC: 4.71 x10E6/uL (ref 4.14–5.80)
RDW: 12.1 % (ref 11.6–15.4)
WBC: 6.9 10*3/uL (ref 3.4–10.8)

## 2023-06-06 LAB — LIPID PANEL WITH LDL/HDL RATIO
Cholesterol, Total: 125 mg/dL (ref 100–199)
HDL: 34 mg/dL — ABNORMAL LOW (ref >39)
LDL Chol Calc (NIH): 74 mg/dL (ref 0–99)
LDL/HDL Ratio: 2.2 ratio (ref 0.0–3.6)
Triglycerides: 85 mg/dL (ref 0–149)
VLDL Cholesterol Cal: 17 mg/dL (ref 5–40)

## 2023-06-06 LAB — PSA: Prostate Specific Ag, Serum: 1 ng/mL (ref 0.0–4.0)

## 2023-06-06 LAB — TSH: TSH: 1.97 u[IU]/mL (ref 0.450–4.500)

## 2023-06-06 NOTE — Progress Notes (Signed)
Labs confirm pre-diabetes range of A1c; Cholesterol remains controled, please continue medications.   All other labs are stable.

## 2023-06-06 NOTE — Telephone Encounter (Signed)
Copied from CRM [id number]. Topic: General - Other >> Jun 06, 2023 10:46 AM Phill Myron wrote: ATTN:  Marjie Skiff, CMA Patient is in route to office to pick up a copy of his lab results.

## 2023-06-06 NOTE — Telephone Encounter (Signed)
noted 

## 2023-07-03 ENCOUNTER — Ambulatory Visit (INDEPENDENT_AMBULATORY_CARE_PROVIDER_SITE_OTHER): Payer: Managed Care, Other (non HMO) | Admitting: Family Medicine

## 2023-07-03 DIAGNOSIS — Z91199 Patient's noncompliance with other medical treatment and regimen due to unspecified reason: Secondary | ICD-10-CM | POA: Insufficient documentation

## 2023-07-03 NOTE — Progress Notes (Signed)
Patient was not seen for appt d/t no call, no show, or late arrival >10 mins past appt time.   Elise T Payne, FNP  [facility] [address] [place], [place] [address] [phone] (phone) [phone] (fax) [facility]  

## 2023-08-24 ENCOUNTER — Other Ambulatory Visit: Payer: Self-pay | Admitting: Physician Assistant

## 2023-08-24 DIAGNOSIS — E7800 Pure hypercholesterolemia, unspecified: Secondary | ICD-10-CM

## 2023-08-24 NOTE — Telephone Encounter (Signed)
Requested Prescriptions  Pending Prescriptions Disp Refills   atorvastatin (LIPITOR) 40 MG tablet [Pharmacy Med Name: ATORVASTATIN 40MG  TABLETS] 90 tablet 0    Sig: TAKE 1 TABLET(40 MG) BY MOUTH DAILY     Cardiovascular:  Antilipid - Statins Failed - 08/24/2023  1:41 PM      Failed - Lipid Panel in normal range within the last 12 months    Cholesterol, Total  Date Value Ref Range Status  06/05/2023 125 100 - 199 mg/dL Final   LDL Chol Calc (NIH)  Date Value Ref Range Status  06/05/2023 74 0 - 99 mg/dL Final   HDL  Date Value Ref Range Status  06/05/2023 34 (L) >39 mg/dL Final   Triglycerides  Date Value Ref Range Status  06/05/2023 85 0 - 149 mg/dL Final         Passed - Patient is not pregnant      Passed - Valid encounter within last 12 months    Recent Outpatient Visits           1 month ago No-show for appointment   [facility] Jacky Kindle, FNP   2 months ago Annual physical exam   [facility] Jacky Kindle, FNP   1 year ago Annual physical exam   [facility] Jacky Kindle, FNP   2 years ago Pure hypercholesterolemia   [facility] Merita Norton T, FNP   2 years ago Pure hypercholesterolemia   [facility] Chrismon, Jodell Cipro, PA-C       Future Appointments             In 5 months Deirdre Evener, MD [facility]   In 9 months Jacky Kindle, FNP [facility], [facility]

## 2023-11-15 ENCOUNTER — Ambulatory Visit: Payer: Managed Care, Other (non HMO) | Admitting: Family Medicine

## 2023-11-30 ENCOUNTER — Telehealth: Payer: Self-pay | Admitting: Family Medicine

## 2023-11-30 DIAGNOSIS — E7800 Pure hypercholesterolemia, unspecified: Secondary | ICD-10-CM

## 2023-11-30 MED ORDER — ATORVASTATIN CALCIUM 40 MG PO TABS: 40.0000 mg | ORAL_TABLET | Freq: Every day | ORAL | 0 refills | Status: DC

## 2023-11-30 NOTE — Telephone Encounter (Signed)
 [facility] pharmacy is requesting refill atorvastatin (LIPITOR) 40 MG tablet   Please advise

## 2023-12-03 ENCOUNTER — Ambulatory Visit: Payer: Managed Care, Other (non HMO) | Admitting: Family Medicine

## 2024-01-02 ENCOUNTER — Ambulatory Visit: Admitting: Family Medicine

## 2024-01-02 ENCOUNTER — Encounter: Payer: Self-pay | Admitting: Family Medicine

## 2024-01-02 VITALS — BP 116/72 | HR 79 | Resp 16 | Ht 72.0 in | Wt 299.0 lb

## 2024-01-02 DIAGNOSIS — B356 Tinea cruris: Secondary | ICD-10-CM

## 2024-01-02 DIAGNOSIS — E7800 Pure hypercholesterolemia, unspecified: Secondary | ICD-10-CM

## 2024-01-02 DIAGNOSIS — Z23 Encounter for immunization: Secondary | ICD-10-CM

## 2024-01-02 DIAGNOSIS — R7309 Other abnormal glucose: Secondary | ICD-10-CM | POA: Diagnosis not present

## 2024-01-02 DIAGNOSIS — I2510 Atherosclerotic heart disease of native coronary artery without angina pectoris: Secondary | ICD-10-CM

## 2024-01-02 DIAGNOSIS — Z789 Other specified health status: Secondary | ICD-10-CM | POA: Insufficient documentation

## 2024-01-02 MED ORDER — KETOCONAZOLE 2 % EX CREA: 1.0000 | TOPICAL_CREAM | Freq: Every day | CUTANEOUS | 0 refills | Status: DC

## 2024-01-02 MED ORDER — ATORVASTATIN CALCIUM 40 MG PO TABS: 40.0000 mg | ORAL_TABLET | Freq: Every day | ORAL | 3 refills | Status: DC

## 2024-01-02 NOTE — Progress Notes (Signed)
 Established patient visit   Patient: Caleb Lopez   DOB: December 18, 1960   63 y.o. Male  MRN: [id number] Visit Date: 01/02/2024  Today's healthcare provider: Carlean Charter, DO   Chief Complaint  Patient presents with   Transitions Of Care   Subjective    HPI Last annual 06/05/2023  Caleb Lopez is a 63 year old male who presents for a transition of care visit (within same clinic due to provider leaving) and recurrent leg swelling.  He is concerned about his vaccination status, particularly regarding the pneumonia vaccine. He recalls having thoracentesis twice in his twenties due to recurrent walking pneumonia while living in [place] . He was treated with antibiotics such as erythromycin and amoxicillin, and NSAIDs like Motrin. He reports he managed to overcome recurrent pneumonia episodes on his own after moving to [place]  in 1995. He has a history of COVID-19 infection, which he managed without vaccination or medical intervention, experiencing severe headaches and sore throat.  He works at [employer], handling blood and serum, and consistently wears protective gear to prevent exposure. He experiences fatigue due to his work schedule, working 12-hour shifts over the weekend, which requires several days to recuperate.   He has had swelling in his left leg for the past two to three years, which worsens during his work shifts and improves with rest. He notes significant swelling during a flight to [place]  a couple of years ago. He attributes some of the swelling to his weight, which has fluctuated between 250 and 300 pounds. He follows a vegetarian diet but struggles with weight management.  He has a history of pericarditis, having experienced episodes in the past that required medical intervention, including thoracentesis and a stent placement.  He also reports a recent episode of tachycardia with a heart rate of 155 bpm after a meal (noted on a watch), which lasted  about three hours.  This has not re-occurred since. No current chest pain or breathing difficulties. He is currently taking atorvastatin  40 mg and krill oil, with varying doses of the latter due to cost.  He reports a persistent case of jock itch, which he has attempted to treat with Lamisil and salicylic acid soap, but finds the cost prohibitive. He recalls a past successful treatment with sun exposure in [place] .     Medications: Outpatient Medications Prior to Visit  Medication Sig   Krill Oil 500 MG CAPS Take 2 capsules by mouth daily.   [DISCONTINUED] atorvastatin  (LIPITOR) 40 MG tablet Take 1 tablet (40 mg total) by mouth daily.   No facility-administered medications prior to visit.    Review of Systems  Constitutional:  Positive for fatigue (attributes to work).  Respiratory: Negative.  Negative for cough, shortness of breath and wheezing.   Cardiovascular:  Positive for leg swelling (left leg). Negative for chest pain and palpitations.  Neurological:  Negative for weakness and headaches.        Objective    BP 116/72 (BP Location: Left Arm, Patient Position: Sitting)   Pulse 79   Resp 16   Ht 6' (1.829 m)   Wt 299 lb (135.6 kg)   SpO2 96%   BMI 40.55 kg/m     Physical Exam Vitals reviewed.  Constitutional:      General: He is not in acute distress.    Appearance: Normal appearance. He is not diaphoretic.  HENT:     Head: Normocephalic and atraumatic.  Eyes:     General: No scleral  icterus.    Conjunctiva/sclera: Conjunctivae normal.  Cardiovascular:     Rate and Rhythm: Normal rate and regular rhythm.     Pulses: Normal pulses.     Heart sounds: Normal heart sounds. No murmur heard. Pulmonary:     Effort: Pulmonary effort is normal. No respiratory distress.     Breath sounds: Normal breath sounds. No wheezing or rhonchi.  Musculoskeletal:     Cervical back: Neck supple.     Right lower leg: No edema.     Left lower leg: No edema.  Lymphadenopathy:      Cervical: No cervical adenopathy.  Skin:    General: Skin is warm and dry.     Findings: No rash.  Neurological:     Mental Status: He is alert and oriented to person, place, and time. Mental status is at baseline.  Psychiatric:        Mood and Affect: Mood normal.        Behavior: Behavior normal.      Results for orders placed or performed in visit on 01/02/24  Comprehensive metabolic panel with GFR  Result Value Ref Range   Glucose 105 (H) 70 - 99 mg/dL   BUN 12 8 - 27 mg/dL   Creatinine, Ser 1.61 0.76 - 1.27 mg/dL   eGFR 93 >09 UE/AVW/0.98   BUN/Creatinine Ratio 13 10 - 24   Sodium 141 134 - 144 mmol/L   Potassium 4.6 3.5 - 5.2 mmol/L   Chloride 105 96 - 106 mmol/L   CO2 18 (L) 20 - 29 mmol/L   Calcium  10.0 8.6 - 10.2 mg/dL   Total Protein 7.0 6.0 - 8.5 g/dL   Albumin 4.4 3.9 - 4.9 g/dL   Globulin, Total 2.6 1.5 - 4.5 g/dL   Bilirubin Total 0.5 0.0 - 1.2 mg/dL   Alkaline Phosphatase 112 44 - 121 IU/L   AST 14 0 - 40 IU/L   ALT 21 0 - 44 IU/L  Hemoglobin A1c  Result Value Ref Range   Hgb A1c MFr Bld 6.2 (H) 4.8 - 5.6 %   Est. average glucose Bld gHb Est-mCnc 131 mg/dL  Lipid panel  Result Value Ref Range   Cholesterol, Total 141 100 - 199 mg/dL   Triglycerides 119 0 - 149 mg/dL   HDL 38 (L) >14 mg/dL   VLDL Cholesterol Cal 23 5 - 40 mg/dL   LDL Chol Calc (NIH) 80 0 - 99 mg/dL   Chol/HDL Ratio 3.7 0.0 - 5.0 ratio    Assessment & Plan    Encounter for Prevnar pneumococcal vaccination -     Pneumococcal conjugate vaccine 20-valent  Need for Tdap vaccination -     Tdap vaccine greater than or equal to 7yo IM  Transition of care  Morbid obesity (HCC)  Coronary artery disease involving native coronary artery of native heart without angina pectoris -     Comprehensive metabolic panel with GFR  Pure hypercholesterolemia -     Lipid panel -     Atorvastatin  Calcium ; Take 1 tablet (40 mg total) by mouth daily.  Dispense: 90 tablet; Refill: 3  Elevated  hemoglobin A1c -     Hemoglobin A1c  Tinea cruris -     Ketoconazole ; Apply 1 Application topically daily. For two weeks  Dispense: 60 g; Refill: 0     Encounter for Prevnar pneumococcal vaccination Reports recurrent pneumonia with no recent episodes. Developed natural immunity to COVID-19. Interested in pneumonia vaccination. - Administer pneumonia vaccine if  not previously received.  Transition of care visit Transition of care visit. Discussed previous vaccinations and work-related fatigue. No abnormal fatigue, chest pain, or shortness of breath reported. - Administer pneumonia vaccine if not previously received. - Schedule physical exam in September.  Morbid obesity Weight is 300 pounds with history of fluctuations. Previously lost weight to 250 pounds through dietary changes but regained. Acknowledges need for weight loss. - Encourage weight loss through dietary changes. - Discuss potential for starting weight loss plan.  Edema of left leg Chronic edema of the left leg, exacerbated by prolonged sitting. Swelling resolves with rest and elevation.  Recommend using compression stockings during work shifts and taking frequent breaks to stand to allow blood and lymphatic flow.  Tinea cruris Chronic tinea cruris with previous use of over-the-counter treatments. Prefers more affordable prescription option. - Prescribe ketoconazole  cream to be applied once daily for two weeks.   Return in about 4 months (around 04/22/2024) for CPE.      I discussed the assessment and treatment plan with the patient  The patient was provided an opportunity to ask questions and all were answered. The patient agreed with the plan and demonstrated an understanding of the instructions.   The patient was advised to call back or seek an in-person evaluation if the symptoms worsen or if the condition fails to improve as anticipated.    Carlean Charter, DO  [facility] [phone] (phone) [phone] (fax)  [facility]

## 2024-01-03 LAB — COMPREHENSIVE METABOLIC PANEL WITH GFR
ALT: 21 IU/L (ref 0–44)
AST: 14 IU/L (ref 0–40)
Albumin: 4.4 g/dL (ref 3.9–4.9)
Alkaline Phosphatase: 112 IU/L (ref 44–121)
BUN/Creatinine Ratio: 13 (ref 10–24)
BUN: 12 mg/dL (ref 8–27)
Bilirubin Total: 0.5 mg/dL (ref 0.0–1.2)
CO2: 18 mmol/L — ABNORMAL LOW (ref 20–29)
Calcium: 10 mg/dL (ref 8.6–10.2)
Chloride: 105 mmol/L (ref 96–106)
Creatinine, Ser: 0.92 mg/dL (ref 0.76–1.27)
Globulin, Total: 2.6 g/dL (ref 1.5–4.5)
Glucose: 105 mg/dL — ABNORMAL HIGH (ref 70–99)
Potassium: 4.6 mmol/L (ref 3.5–5.2)
Sodium: 141 mmol/L (ref 134–144)
Total Protein: 7 g/dL (ref 6.0–8.5)
eGFR: 93 mL/min/{1.73_m2} (ref >59)

## 2024-01-03 LAB — HEMOGLOBIN A1C
Est. average glucose Bld gHb Est-mCnc: 131 mg/dL
Hgb A1c MFr Bld: 6.2 % — ABNORMAL HIGH (ref 4.8–5.6)

## 2024-01-03 LAB — LIPID PANEL
Chol/HDL Ratio: 3.7 ratio (ref 0.0–5.0)
Cholesterol, Total: 141 mg/dL (ref 100–199)
HDL: 38 mg/dL — ABNORMAL LOW (ref >39)
LDL Chol Calc (NIH): 80 mg/dL (ref 0–99)
Triglycerides: 128 mg/dL (ref 0–149)
VLDL Cholesterol Cal: 23 mg/dL (ref 5–40)

## 2024-01-06 ENCOUNTER — Encounter: Payer: Self-pay | Admitting: Family Medicine

## 2024-01-11 NOTE — Telephone Encounter (Signed)
 Pt's wife Antony Baumgartner called. They are wanting to know about the low level of CO2 that shows in the results. Asking if you can please respond via mychart. Pt is at work.

## 2024-01-11 NOTE — Telephone Encounter (Signed)
Please see the pt question below.

## 2024-02-05 ENCOUNTER — Encounter: Payer: Self-pay | Admitting: Dermatology

## 2024-02-05 ENCOUNTER — Ambulatory Visit: Admitting: Dermatology

## 2024-02-05 DIAGNOSIS — Z79899 Other long term (current) drug therapy: Secondary | ICD-10-CM

## 2024-02-05 DIAGNOSIS — D229 Melanocytic nevi, unspecified: Secondary | ICD-10-CM

## 2024-02-05 DIAGNOSIS — W908XXA Exposure to other nonionizing radiation, initial encounter: Secondary | ICD-10-CM

## 2024-02-05 DIAGNOSIS — L820 Inflamed seborrheic keratosis: Secondary | ICD-10-CM

## 2024-02-05 DIAGNOSIS — D1801 Hemangioma of skin and subcutaneous tissue: Secondary | ICD-10-CM

## 2024-02-05 DIAGNOSIS — L821 Other seborrheic keratosis: Secondary | ICD-10-CM

## 2024-02-05 DIAGNOSIS — L814 Other melanin hyperpigmentation: Secondary | ICD-10-CM

## 2024-02-05 DIAGNOSIS — L578 Other skin changes due to chronic exposure to nonionizing radiation: Secondary | ICD-10-CM | POA: Diagnosis not present

## 2024-02-05 DIAGNOSIS — B353 Tinea pedis: Secondary | ICD-10-CM

## 2024-02-05 DIAGNOSIS — Z1283 Encounter for screening for malignant neoplasm of skin: Secondary | ICD-10-CM | POA: Diagnosis not present

## 2024-02-05 DIAGNOSIS — Z872 Personal history of diseases of the skin and subcutaneous tissue: Secondary | ICD-10-CM

## 2024-02-05 DIAGNOSIS — Z7189 Other specified counseling: Secondary | ICD-10-CM

## 2024-02-05 DIAGNOSIS — L858 Other specified epidermal thickening: Secondary | ICD-10-CM

## 2024-02-05 NOTE — Progress Notes (Signed)
 Follow-Up Visit   Subjective  Caleb Lopez is a 63 y.o. male who presents for the following: Skin Cancer Screening and Full Body Skin Exam, hx of precancers.   The patient presents for Total-Body Skin Exam (TBSE) for skin cancer screening and mole check. The patient has spots, moles and lesions to be evaluated, some may be new or changing and the patient may have concern these could be cancer.  Wife is with patient and contributes to history.   The following portions of the chart were reviewed this encounter and updated as appropriate: medications, allergies, medical history  Review of Systems:  No other skin or systemic complaints except as noted in HPI or Assessment and Plan.  Objective  Well appearing patient in no apparent distress; mood and affect are within normal limits.  A full examination was performed including scalp, head, eyes, ears, nose, lips, neck, chest, axillae, abdomen, back, buttocks, bilateral upper extremities, bilateral lower extremities, hands, feet, fingers, toes, fingernails, and toenails. All findings within normal limits unless otherwise noted below.   Relevant physical exam findings are noted in the Assessment and Plan.  back x 12, face x 6, right shoulder x 5, left bicep x 1, left chest x 1 (25) Stuck-on, waxy, tan-brown papule or plaque --Discussed benign etiology and prognosis.  right lower eyelid margin Stuck-on, waxy, tan-brown papules and plaques -- Discussed benign etiology and prognosis.   Assessment & Plan   SKIN CANCER SCREENING PERFORMED TODAY.  ACTINIC DAMAGE - Chronic condition, secondary to cumulative UV/sun exposure - diffuse scaly erythematous macules with underlying dyspigmentation - Recommend daily broad spectrum sunscreen SPF 30+ to sun-exposed areas, reapply every 2 hours as needed.  - Staying in the shade or wearing long sleeves, sun glasses (UVA+UVB protection) and wide brim hats (4-inch brim around the entire circumference of  the hat) are also recommended for sun protection.  - Call for new or changing lesions.  LENTIGINES, SEBORRHEIC KERATOSES,  - Benign normal skin lesions - Benign-appearing - Call for any changes  HEMANGIOMA Exam: red papule(s) Discussed benign nature. Recommend observation. Call for changes.  Counseling for BBL / IPL / Laser and Coordination of Care Discussed the treatment option of Broad Band Light (BBL) /Intense Pulsed Light (IPL)/ Laser for skin discoloration, including brown spots and redness.  Typically we recommend at least 1-3 treatment sessions about 5-8 weeks apart for best results.  Cannot have tanned skin when BBL performed, and regular use of sunscreen/photoprotection is advised after the procedure to help maintain results. The patient's condition may also require "maintenance treatments" in the future.  The fee for BBL / laser treatments is $350 per treatment session for the whole face.  A fee can be quoted for other parts of the body.  Insurance typically does not pay for BBL/laser treatments and therefore the fee is an out-of-pocket cost. Recommend prophylactic valtrex treatment. Once scheduled for procedure, will send Rx in prior to patient's appointment.    MELANOCYTIC NEVI - Tan-brown and/or pink-flesh-colored symmetric macules and papules - Benign appearing on exam today - Observation - Call clinic for new or changing moles - Recommend daily use of broad spectrum spf 30+ sunscreen to sun-exposed areas.   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 Genetic in nature. No cure. - Observe. - If desired, patient can use an emollient (moisturizer) containing ammonium lactate (AmLactin), urea or salicylic acid once a day to smooth the area  Recommend starting moisturizer with exfoliant (Urea, Salicylic acid,  or Lactic acid) one to two times daily to help smooth rough and bumpy skin.  OTC options include Cetaphil Rough and Bumpy lotion (Urea), Eucerin Roughness  Relief lotion or spot treatment cream (Urea), CeraVe SA lotion/cream for Rough and Bumpy skin (Sal Acid), Gold Bond Rough and Bumpy cream (Sal Acid), and AmLactin 12% lotion/cream (Lactic Acid).  If applying in morning, also apply sunscreen to sun-exposed areas, since these exfoliating moisturizers can increase sensitivity to sun.    TINEA PEDIS Exam: Scaling and maceration web spaces and over distal and lateral soles. Chronic and persistent condition with duration or expected duration over one year. Condition is symptomatic / bothersome to patient. Not to goal. Treatment Plan: Re start Ketoconazole  2% cream apply to feet every night at bedtime.  INFLAMED SEBORRHEIC KERATOSIS (25) back x 12, face x 6, right shoulder x 5, left bicep x 1, left chest x 1 (25) Symptomatic, irritating, patient would like treated.  Destruction of lesion - back x 12, face x 6, right shoulder x 5, left bicep x 1, left chest x 1 (25) Complexity: simple   Destruction method: cryotherapy   Informed consent: discussed and consent obtained   Timeout:  patient name, date of birth, surgical site, and procedure verified Lesion destroyed using liquid nitrogen: Yes   Region frozen until ice ball extended beyond lesion: Yes   Outcome: patient tolerated procedure well with no complications   Post-procedure details: wound care instructions given   SEBORRHEIC KERATOSIS, INFLAMED right lower eyelid margin Symptomatic, irritating, patient would like treated.  Destruction of lesion - right lower eyelid margin Complexity: simple   Destruction method: cryotherapy   Informed consent: discussed and consent obtained   Timeout:  patient name, date of birth, surgical site, and procedure verified Lesion destroyed using liquid nitrogen: Yes   Region frozen until ice ball extended beyond lesion: Yes   Outcome: patient tolerated procedure well with no complications   Post-procedure details: wound care instructions given     Return in  about 1 year (around 02/04/2025) for TBSE, ISK .  I, Clara Crisp, CMA, am acting as scribe for Celine Collard, MD .   Documentation: I have reviewed the above documentation for accuracy and completeness, and I agree with the above.  Celine Collard, MD

## 2024-02-05 NOTE — Patient Instructions (Addendum)
 Seborrheic Keratosis  What causes seborrheic keratoses? Seborrheic keratoses are harmless, common skin growths that first appear during adult life.  As time goes by, more growths appear.  Some people may develop a large number of them.  Seborrheic keratoses appear on both covered and uncovered body parts.  They are not caused by sunlight.  The tendency to develop seborrheic keratoses can be inherited.  They vary in color from skin-colored to gray, brown, or even black.  They can be either smooth or have a rough, warty surface.   Seborrheic keratoses are superficial and look as if they were stuck on the skin.  Under the microscope this type of keratosis looks like layers upon layers of skin.  That is why at times the top layer may seem to fall off, but the rest of the growth remains and re-grows.    Treatment Seborrheic keratoses do not need to be treated, but can easily be removed in the office.  Seborrheic keratoses often cause symptoms when they rub on clothing or jewelry.  Lesions can be in the way of shaving.  If they become inflamed, they can cause itching, soreness, or burning.  Removal of a seborrheic keratosis can be accomplished by freezing, burning, or surgery. If any spot bleeds, scabs, or grows rapidly, please return to have it checked, as these can be an indication of a skin cancer.   Cryotherapy Aftercare  Wash gently with soap and water everyday.   Apply Vaseline and Band-Aid daily until healed.      Due to recent changes in healthcare laws, you may see results of your pathology and/or laboratory studies on MyChart before the doctors have had a chance to review them. We understand that in some cases there may be results that are confusing or concerning to you. Please understand that not all results are received at the same time and often the doctors may need to interpret multiple results in order to provide you with the best plan of care or course of treatment. Therefore, we ask  that you please give [place] 2 business days to thoroughly review all your results before contacting the office for clarification. Should we see a critical lab result, you will be contacted sooner.   If You Need Anything After Your Visit  If you have any questions or concerns for your doctor, please call our main line at [phone] and press option 4 to reach your doctor's medical assistant. If no one answers, please leave a voicemail as directed and we will return your call as soon as possible. Messages left after 4 pm will be answered the following business day.   You may also send [place] a message via MyChart. We typically respond to MyChart messages within 1-2 business days.  For prescription refills, please ask your pharmacy to contact our office. Our fax number is [phone].  If you have an urgent issue when the clinic is closed that cannot wait until the next business day, you can page your doctor at the number below.    Please note that while we do our best to be available for urgent issues outside of office hours, we are not available 24/7.   If you have an urgent issue and are unable to reach [place], you may choose to seek medical care at your doctor's office, retail clinic, urgent care center, or emergency room.  If you have a medical emergency, please immediately call 911 or go to the emergency department.  Pager Numbers  - Dr.  Gwen Pounds: [phone]  - Dr. Roseanne Reno: [phone]  - Dr. Katrinka Blazing: [phone]   In the event of inclement weather, please call our main line at [phone] for an update on the status of any delays or closures.  Dermatology Medication Tips: Please keep the boxes that topical medications come in in order to help keep track of the instructions about where and how to use these. Pharmacies typically print the medication instructions only on the boxes and not directly on the medication tubes.   If your medication is too expensive, please contact our office at  [phone] option 4 or send [place] a message through MyChart.   We are unable to tell what your co-pay for medications will be in advance as this is different depending on your insurance coverage. However, we may be able to find a substitute medication at lower cost or fill out paperwork to get insurance to cover a needed medication.   If a prior authorization is required to get your medication covered by your insurance company, please allow [place] 1-2 business days to complete this process.  Drug prices often vary depending on where the prescription is filled and some pharmacies may offer cheaper prices.  The website www.goodrx.com contains coupons for medications through different pharmacies. The prices here do not account for what the cost may be with help from insurance (it may be cheaper with your insurance), but the website can give you the price if you did not use any insurance.  - You can print the associated coupon and take it with your prescription to the pharmacy.  - You may also stop by our office during regular business hours and pick up a GoodRx coupon card.  - If you need your prescription sent electronically to a different pharmacy, notify our office through [facility] or by phone at [phone] option 4.     Si Usted Necesita Algo Despus de Su Visita  Tambin puede enviarnos un mensaje a travs de Clinical cytogeneticist. Por lo general respondemos a los mensajes de MyChart en el transcurso de 1 a 2 das hbiles.  Para renovar recetas, por favor pida a su farmacia que se ponga en contacto con nuestra oficina. Caleb Lopez de fax es [place] [phone].  Si tiene un asunto urgente cuando la clnica est cerrada y que no puede esperar hasta el siguiente da hbil, puede llamar/localizar a su doctor(a) al nmero que aparece a continuacin.   Por favor, tenga en cuenta que aunque hacemos todo lo posible para estar disponibles para asuntos urgentes fuera del horario de [place], no estamos  disponibles las 24 horas del da, los 7 [address]  [address] de la [place].   Si tiene un problema urgente y no puede comunicarse con nosotros, puede optar por buscar atencin mdica  en el consultorio de su doctor(a), en una clnica privada, en un centro de atencin urgente o en una sala de emergencias.  Si tiene Engineer, drilling, por favor llame inmediatamente al 911 o vaya a la sala de emergencias.  Nmeros de bper  - Dr. Gwen Pounds: [phone]  - Dra. Roseanne Reno: [phone]  - Dr. Katrinka Blazing: [phone]   En caso de inclemencias del tiempo, por favor llame a Lacy Duverney principal al [phone] para una actualizacin sobre el [place] de cualquier retraso o cierre.  Consejos para la medicacin en dermatologa: Por favor, guarde las cajas en las que vienen los medicamentos de uso tpico para ayudarle a seguir las instrucciones sobre dnde y cmo usarlos. Las farmacias generalmente imprimen las instrucciones del  medicamento slo en las cajas y no directamente en los tubos del medicamento.   Si su medicamento es muy caro, por favor, pngase en contacto con Rolm Gala llamando al [phone] y presione la opcin 4 o envenos un mensaje a travs de Clinical cytogeneticist.   No podemos decirle cul ser su copago por los medicamentos por adelantado ya que esto es diferente dependiendo de la cobertura de su seguro. Sin embargo, es posible que podamos encontrar un medicamento sustituto a Audiological scientist un formulario para que el seguro cubra el medicamento que se considera necesario.   Si se requiere una autorizacin previa para que su compaa de seguros [place] su medicamento, por favor permtanos de 1 a 2 das hbiles para completar [address]  Los precios de los medicamentos varan con frecuencia dependiendo del Environmental consultant de dnde se surte la receta y alguna farmacias pueden ofrecer precios ms baratos.  El sitio web www.goodrx.com tiene cupones para medicamentos de Health and safety inspector. Los precios aqu no  tienen en cuenta lo que podra costar con la ayuda del seguro (puede ser ms barato con su seguro), pero el sitio web puede darle el precio si no utiliz Tourist information centre manager.  - Puede imprimir el cupn correspondiente y llevarlo con su receta a la farmacia.  - Tambin puede pasar por nuestra oficina durante el horario de atencin regular y Education officer, museum una tarjeta de cupones de GoodRx.  - Si necesita que su receta se enve electrnicamente a una farmacia diferente, informe a nuestra oficina a travs de MyChart de [place] o por telfono llamando al [phone] y presione la opcin 4.

## 2024-02-06 ENCOUNTER — Ambulatory Visit: Payer: Managed Care, Other (non HMO) | Admitting: Dermatology

## 2024-02-08 ENCOUNTER — Ambulatory Visit: Payer: Self-pay

## 2024-02-08 DIAGNOSIS — B356 Tinea cruris: Secondary | ICD-10-CM

## 2024-02-08 NOTE — Telephone Encounter (Signed)
 Copied from CRM [id number]. Topic: Clinical - Medication Question >> Feb 08, 2024  4:30 PM Hamp Levine R wrote: Reason for CRM: Patient states he was in to see the doctor at the end of April due to a rash, states he was prescribed ketoconazole  (NIZORAL ) 2 % cream. Used for the two weeks and states it did not help. Is requesting to be prescribed Lamisil or something similar that contains, terbinafine hydrochloride cream 1% (anti-fungal).  Patient can be reached at [phone]

## 2024-02-08 NOTE — Telephone Encounter (Signed)
 LVM for pt to return call for triage - 2nd attempt.

## 2024-02-11 ENCOUNTER — Ambulatory Visit: Payer: Self-pay

## 2024-02-11 ENCOUNTER — Telehealth: Payer: Self-pay

## 2024-02-11 DIAGNOSIS — Z1211 Encounter for screening for malignant neoplasm of colon: Secondary | ICD-10-CM

## 2024-02-11 NOTE — Telephone Encounter (Signed)
 Patient has a hx of tinea cruris. Was recently prescribed Ketoconazole  ( Nizoral ) cream and he reports it no longer works for him.   Patient noted though he has used it in the past, it no longer effective.  Patient reports s/s have not worsened, they have just not progressed.   Patient reports using Lamisil Cream (OTC) in the past and notes it worked very well. But the patient relayed it is too expensive OTC.   Patient is requesting a prescription for Lamisil instead.   Message routed appropriately.

## 2024-02-11 NOTE — Telephone Encounter (Deleted)
 Copied from CRM [id number]. Topic: Referral - Request for Referral >> Feb 11, 2024  9:19 AM Leory Rands wrote: Patient is calling to request a referral for GI for colonoscopy to Dr. Ole Berkeley.

## 2024-02-11 NOTE — Telephone Encounter (Signed)
 Attempted to call pt regarding issue noted below. No response. Left discrete VM appropriately.      Copied from CRM [id number]. Topic: Clinical - Prescription Issue >> Feb 11, 2024  9:25 AM Leory Rands wrote: Reason for CRM: Patient is calling to report that ketoconazole  (NIZORAL ) 2 % cream [[id number]] for Jock itch. Request Lamisil for this. Please advise

## 2024-02-11 NOTE — Telephone Encounter (Signed)
 Patient has been scheduled 02/14/24 at 1pm with [place]walt, Janna.

## 2024-02-11 NOTE — Telephone Encounter (Signed)
 Copied from CRM [id number]. Topic: Referral - Request for Referral >> Feb 11, 2024  9:19 AM Leory Rands wrote: Patient is calling to request a referral for GI for colonoscopy to Dr. Ole Berkeley.

## 2024-02-11 NOTE — Telephone Encounter (Signed)
 Copied from CRM [id number]. Topic: Clinical - Red Word Triage >> Feb 11, 2024  9:20 AM Leory Rands wrote: Red Word that prompted transfer to Nurse Triage: Patient is calling to report that he feel anxious, fuzzy in his head and body some compression in his chest, left leg swelling with heart rate elevated 5/31/ & 02/10/24 138 BPM, 98 BPM, 150 BPM, 149 Elevated heart rate for over a month.     Chief Complaint: elevated heart rate, comes and goes. Feels anxious with this. Symptoms: above Frequency: 1 month Pertinent Negatives: Patient denies chest pain Disposition: [] ED /[] Urgent Care (no appt availability in office) / [x] Appointment(In office/virtual)/ []  [facility]/ [] Home Care/ [] Refused Recommended Disposition /[] [facility]/ []  Follow-up with PCP Additional Notes: agrees with appointment.  Reason for Disposition  [1] Palpitations AND [2] no improvement after using Care Advice  Answer Assessment - Initial Assessment Questions 1. DESCRIPTION: "Please describe your heart rate or heartbeat that you are having" (e.g., fast/slow, regular/irregular, skipped or extra beats, "palpitations")     Fast heart rate, comes and goes 2. ONSET: "When did it start?" (Minutes, hours or days)      1 month ago 3. DURATION: "How long does it last" (e.g., seconds, minutes, hours)     Lasts a few minutes to hours 4. PATTERN "Does it come and go, or has it been constant since it started?"  "Does it get worse with exertion?"   "Are you feeling it now?"     Comes and goes 5. TAP: "Using your hand, can you tap out what you are feeling on a chair or table in front of you, so that I can hear?" (Note: not all patients can do this)       150 , 98 6. HEART RATE: "Can you tell me your heart rate?" "How many beats in 15 seconds?"  (Note: not all patients can do this)       150, 138, 98 7. RECURRENT SYMPTOM: "Have you ever had this before?" If Yes, ask: "When was the last time?" and "What happened that  time?"      no 8. CAUSE: "What do you think is causing the palpitations?"     unsure 9. CARDIAC HISTORY: "Do you have any history of heart disease?" (e.g., heart attack, angina, bypass surgery, angioplasty, arrhythmia)      no 10. OTHER SYMPTOMS: "Do you have any other symptoms?" (e.g., dizziness, chest pain, sweating, difficulty breathing)       anxiety 11. PREGNANCY: "Is there any chance you are pregnant?" "When was your last menstrual period?"       N/a  Protocols used: Heart Rate and Heartbeat Questions-A-AH

## 2024-02-13 MED ORDER — TERBINAFINE HCL 1 % EX CREA: 1.0000 | TOPICAL_CREAM | Freq: Two times a day (BID) | CUTANEOUS | 0 refills | Status: DC

## 2024-02-13 NOTE — Addendum Note (Signed)
 Addended by: Judyann Number on: 02/13/2024 04:57 PM   Modules accepted: Orders

## 2024-02-14 ENCOUNTER — Observation Stay
Admission: EM | Admit: 2024-02-14 | Discharge: 2024-02-15 | Disposition: A | Discharge status: Home / Self Care | Attending: Internal Medicine | Admitting: Internal Medicine

## 2024-02-14 ENCOUNTER — Telehealth: Payer: Self-pay

## 2024-02-14 ENCOUNTER — Encounter: Payer: Self-pay | Admitting: Nurse Practitioner

## 2024-02-14 ENCOUNTER — Other Ambulatory Visit: Payer: Self-pay

## 2024-02-14 ENCOUNTER — Other Ambulatory Visit (HOSPITAL_COMMUNITY): Payer: Self-pay

## 2024-02-14 ENCOUNTER — Ambulatory Visit: Admitting: Physician Assistant

## 2024-02-14 ENCOUNTER — Telehealth (HOSPITAL_COMMUNITY): Payer: Self-pay | Admitting: Pharmacy Technician

## 2024-02-14 ENCOUNTER — Emergency Department

## 2024-02-14 DIAGNOSIS — Z87891 Personal history of nicotine dependence: Secondary | ICD-10-CM | POA: Insufficient documentation

## 2024-02-14 DIAGNOSIS — I4892 Unspecified atrial flutter: Principal | ICD-10-CM | POA: Insufficient documentation

## 2024-02-14 DIAGNOSIS — R002 Palpitations: Secondary | ICD-10-CM | POA: Diagnosis present

## 2024-02-14 DIAGNOSIS — R7989 Other specified abnormal findings of blood chemistry: Secondary | ICD-10-CM | POA: Diagnosis not present

## 2024-02-14 DIAGNOSIS — I2510 Atherosclerotic heart disease of native coronary artery without angina pectoris: Secondary | ICD-10-CM | POA: Insufficient documentation

## 2024-02-14 DIAGNOSIS — Z860100 Personal history of colon polyps, unspecified: Secondary | ICD-10-CM

## 2024-02-14 DIAGNOSIS — I110 Hypertensive heart disease with heart failure: Secondary | ICD-10-CM | POA: Insufficient documentation

## 2024-02-14 DIAGNOSIS — I252 Old myocardial infarction: Secondary | ICD-10-CM | POA: Diagnosis not present

## 2024-02-14 DIAGNOSIS — F1090 Alcohol use, unspecified, uncomplicated: Secondary | ICD-10-CM | POA: Insufficient documentation

## 2024-02-14 DIAGNOSIS — I4891 Unspecified atrial fibrillation: Secondary | ICD-10-CM | POA: Diagnosis not present

## 2024-02-14 DIAGNOSIS — R7303 Prediabetes: Secondary | ICD-10-CM | POA: Insufficient documentation

## 2024-02-14 DIAGNOSIS — E785 Hyperlipidemia, unspecified: Secondary | ICD-10-CM | POA: Insufficient documentation

## 2024-02-14 DIAGNOSIS — G4733 Obstructive sleep apnea (adult) (pediatric): Secondary | ICD-10-CM | POA: Insufficient documentation

## 2024-02-14 DIAGNOSIS — Z6841 Body Mass Index (BMI) 40.0 and over, adult: Secondary | ICD-10-CM | POA: Insufficient documentation

## 2024-02-14 HISTORY — DX: Unspecified right bundle-branch block: I45.10

## 2024-02-14 HISTORY — DX: Atherosclerotic heart disease of native coronary artery without angina pectoris: I25.10

## 2024-02-14 HISTORY — DX: Morbid (severe) obesity due to excess calories: E66.01

## 2024-02-14 HISTORY — DX: Prediabetes: R73.03

## 2024-02-14 LAB — CBC WITH DIFFERENTIAL/PLATELET
Abs Immature Granulocytes: 0.06 10*3/uL (ref 0.00–0.07)
Basophils Absolute: 0 10*3/uL (ref 0.0–0.1)
Basophils Relative: 1 %
Eosinophils Absolute: 0.2 10*3/uL (ref 0.0–0.5)
Eosinophils Relative: 2 %
HCT: 49.5 % (ref 39.0–52.0)
Hemoglobin: 16.3 g/dL (ref 13.0–17.0)
Immature Granulocytes: 1 %
Lymphocytes Relative: 27 %
Lymphs Abs: 2.3 10*3/uL (ref 0.7–4.0)
MCH: 30.8 pg (ref 26.0–34.0)
MCHC: 32.9 g/dL (ref 30.0–36.0)
MCV: 93.6 fL (ref 80.0–100.0)
Monocytes Absolute: 1.1 10*3/uL — ABNORMAL HIGH (ref 0.1–1.0)
Monocytes Relative: 12 %
Neutro Abs: 5 10*3/uL (ref 1.7–7.7)
Neutrophils Relative %: 57 %
Platelets: 260 10*3/uL (ref 150–400)
RBC: 5.29 MIL/uL (ref 4.22–5.81)
RDW: 12.9 % (ref 11.5–15.5)
WBC: 8.6 10*3/uL (ref 4.0–10.5)
nRBC: 0 % (ref 0.0–0.2)

## 2024-02-14 LAB — BASIC METABOLIC PANEL WITH GFR
Anion gap: 7 (ref 5–15)
BUN: 17 mg/dL (ref 8–23)
CO2: 22 mmol/L (ref 22–32)
Calcium: 9.4 mg/dL (ref 8.9–10.3)
Chloride: 109 mmol/L (ref 98–111)
Creatinine, Ser: 1 mg/dL (ref 0.61–1.24)
GFR, Estimated: >60 mL/min (ref >60)
Glucose, Bld: 124 mg/dL — ABNORMAL HIGH (ref 70–99)
Potassium: 4.1 mmol/L (ref 3.5–5.1)
Sodium: 138 mmol/L (ref 135–145)

## 2024-02-14 LAB — TROPONIN I (HIGH SENSITIVITY)
Troponin I (High Sensitivity): 18 ng/L — ABNORMAL HIGH (ref <18)
Troponin I (High Sensitivity): 13 ng/L (ref <18)

## 2024-02-14 LAB — PROTIME-INR
INR: 1 (ref 0.8–1.2)
Prothrombin Time: 13.5 s (ref 11.4–15.2)

## 2024-02-14 LAB — BRAIN NATRIURETIC PEPTIDE: B Natriuretic Peptide: 252.4 pg/mL — ABNORMAL HIGH (ref 0.0–100.0)

## 2024-02-14 LAB — APTT: aPTT: 29 s (ref 24–36)

## 2024-02-14 LAB — HEPARIN LEVEL (UNFRACTIONATED): Heparin Unfractionated: 0.37 [IU]/mL (ref 0.30–0.70)

## 2024-02-14 MED ORDER — POLYETHYLENE GLYCOL 3350 17 G PO PACK: 17.0000 g | PACK | Freq: Every day | ORAL | Status: DC | PRN

## 2024-02-14 MED ORDER — DILTIAZEM HCL 25 MG/5ML IV SOLN
5.0000 mg | Freq: Once | INTRAVENOUS | Status: AC
  Administered 2024-02-14: 5 mg via INTRAVENOUS
  Filled 2024-02-14: qty 5

## 2024-02-14 MED ORDER — NA SULFATE-K SULFATE-MG SULF 17.5-3.13-1.6 GM/177ML PO SOLN: 1.0000 | Freq: Once | ORAL | 0 refills | Status: DC

## 2024-02-14 MED ORDER — SODIUM CHLORIDE 0.9% FLUSH
3.0000 mL | Freq: Two times a day (BID) | INTRAVENOUS | Status: DC
  Administered 2024-02-15: 5 mL via INTRAVENOUS
  Administered 2024-02-15: 10 mL via INTRAVENOUS

## 2024-02-14 MED ORDER — HEPARIN (PORCINE) 25000 UT/250ML-% IV SOLN
1800.0000 [IU]/h | INTRAVENOUS | Status: DC
  Administered 2024-02-14: 1600 [IU]/h via INTRAVENOUS
  Administered 2024-02-15: 1800 [IU]/h via INTRAVENOUS
  Administered 2024-02-15: 1600 [IU]/h via INTRAVENOUS
  Filled 2024-02-14 (×2): qty 250

## 2024-02-14 MED ORDER — ACETAMINOPHEN 650 MG RE SUPP: 650.0000 mg | Freq: Four times a day (QID) | RECTAL | Status: DC | PRN

## 2024-02-14 MED ORDER — DILTIAZEM HCL 25 MG/5ML IV SOLN: 10.0000 mg | Freq: Once | INTRAVENOUS | Status: DC

## 2024-02-14 MED ORDER — ACETAMINOPHEN 325 MG PO TABS
650.0000 mg | ORAL_TABLET | Freq: Four times a day (QID) | ORAL | Status: DC | PRN
Start: 2024-02-14 — End: 2024-02-15

## 2024-02-14 MED ORDER — HEPARIN BOLUS VIA INFUSION
6000.0000 [IU] | Freq: Once | INTRAVENOUS | Status: AC
  Administered 2024-02-14: 6000 [IU] via INTRAVENOUS
  Filled 2024-02-14: qty 6000

## 2024-02-14 MED ORDER — SODIUM CHLORIDE 0.9% FLUSH: 3.0000 mL | INTRAVENOUS | Status: DC | PRN

## 2024-02-14 MED ORDER — BISACODYL 5 MG PO TBEC: 5.0000 mg | DELAYED_RELEASE_TABLET | Freq: Every day | ORAL | Status: DC | PRN

## 2024-02-14 MED ORDER — HYDRALAZINE HCL 20 MG/ML IJ SOLN: 10.0000 mg | Freq: Four times a day (QID) | INTRAMUSCULAR | Status: DC | PRN

## 2024-02-14 MED ORDER — ATORVASTATIN CALCIUM 20 MG PO TABS
40.0000 mg | ORAL_TABLET | Freq: Every day | ORAL | Status: DC
  Administered 2024-02-15: 40 mg via ORAL
  Filled 2024-02-14: qty 2

## 2024-02-14 MED ORDER — ALBUTEROL SULFATE (2.5 MG/3ML) 0.083% IN NEBU: 2.5000 mg | INHALATION_SOLUTION | Freq: Four times a day (QID) | RESPIRATORY_TRACT | Status: DC | PRN

## 2024-02-14 MED ORDER — DIGOXIN 0.25 MG/ML IJ SOLN
0.2500 mg | Freq: Once | INTRAMUSCULAR | Status: AC
  Administered 2024-02-14: 0.25 mg via INTRAVENOUS
  Filled 2024-02-14 (×2): qty 2

## 2024-02-14 MED ORDER — DILTIAZEM HCL-DEXTROSE 125-5 MG/125ML-% IV SOLN (PREMIX)
5.0000 mg/h | INTRAVENOUS | Status: DC
  Administered 2024-02-14: 5 mg/h via INTRAVENOUS
  Administered 2024-02-15: 10 mg/h via INTRAVENOUS
  Filled 2024-02-14 (×2): qty 125

## 2024-02-14 NOTE — Consult Note (Signed)
 Pharmacy Consult Note - Anticoagulation  Pharmacy Consult for heparin Indication: atrial fibrillation  PATIENT MEASUREMENTS: Height: 6' (182.9 cm) Weight: 135.6 kg (298 lb 15.1 oz) IBW/kg (Calculated) : 77.6 HEPARIN DW (KG): 108.6  VITAL SIGNS: Temp: 97.9 F (36.6 C) (06/05 2001) Temp Source: Oral (06/05 2001) BP: 109/64 (06/05 2001) Pulse Rate: 79 (06/05 2001)  Recent Labs    02/14/24 1236 02/14/24 1603 02/14/24 2026  HGB 16.3  --   --   HCT 49.5  --   --   PLT 260  --   --   APTT 29  --   --   LABPROT 13.5  --   --   INR 1.0  --   --   HEPARINUNFRC  --   --  0.37  CREATININE 1.00  --   --   TROPONINIHS 18* 13  --     Estimated Creatinine Clearance: 106.4 mL/min (by C-G formula based on SCr of 1 mg/dL).  PAST MEDICAL HISTORY: Past Medical History:  Diagnosis Date   Borderline diabetes    CAD (coronary artery disease)    a. 07/2008 NSTEMI/Cath ([facility]): LM nl, LAD 60, LCX nl, RCA nl, EF 55%->Med rx. Presentation felt to be secondary to pericarditis.   Morbid obesity (HCC)    Pericarditis 07/2008   RBBB     ASSESSMENT: 63 y.o. male with PMH including CAD, HLD, pericarditis, obesity is presenting with symptomatic atrial fibrillation. He endorses anxiety, fuzzyheadedness, chest pressure, and there is leg swelling as well. HR currently in the 150s. Patient is not on chronic anticoagulation per chart review. CHA2DS2VASc is at least 2 (HTN, CAD). Pharmacy has been consulted to initiate and manage heparin intravenous infusion.  Pertinent medications: No chronic anticoag prior to admission  Goal(s) of therapy: Heparin level 0.3 - 0.7 units/mL Monitor platelets by anticoagulation protocol: Yes   Baseline anticoagulation labs: Recent Labs    02/14/24 1236  APTT 29  INR 1.0  HGB 16.3  PLT 260    Date/Time Heparin Level Rate   06/05 @ 2026 0.37  1600 units/hr           PLAN: 0605 @ 2026 HL 0.37- Level Therapeutic x 1 Continue heparin infusion at 1600  units/hour. Check confirmatory heparin level in 6 hours. Continue to monitor HL and CBC daily while on heparin infusion.   Malone Sear, PharmD, BCPS Clinical Pharmacist 02/14/2024 8:50 PM

## 2024-02-14 NOTE — ED Triage Notes (Signed)
 Pt here with tachycardia, 158. Pt denies cp or sob. Pt denies hx of atrial fibrillation. Pt denies any symptoms.

## 2024-02-14 NOTE — Progress Notes (Signed)
 Gastroenterology Pre-Procedure Review  Request Date: 06/05/24 Requesting Physician: Dr. Ole Berkeley  PATIENT REVIEW QUESTIONS: The patient responded to the following health history questions as indicated:    1. Are you having any GI issues? no 2. Do you have a personal history of Polyps? yes (last colonoscopy 06/03/19 performed by Dr. Ole Berkeley recommended repeat in 5 years) 3. Do you have a family history of Colon Cancer or Polyps? no 4. Diabetes Mellitus? no 5. Joint replacements in the past 12 months?no 6. Major health problems in the past 3 months?no 7. Any artificial heart valves, MVP, or defibrillator?no    MEDICATIONS & ALLERGIES:    Patient reports the following regarding taking any anticoagulation/antiplatelet therapy:   Plavix, Coumadin, Eliquis, Xarelto, Lovenox, Pradaxa, Brilinta, or Effient? no Aspirin? no  Patient confirms/reports the following medications:  Current Outpatient Medications  Medication Sig Dispense Refill   atorvastatin  (LIPITOR) 40 MG tablet Take 1 tablet (40 mg total) by mouth daily. 90 tablet 3   ketoconazole  (NIZORAL ) 2 % cream Apply 1 Application topically daily. For two weeks 60 g 0   Krill Oil 500 MG CAPS Take 2 capsules by mouth daily.     Na Sulfate-K Sulfate-Mg Sulfate concentrate (SUPREP) 17.5-3.13-1.6 GM/177ML SOLN Take 1 kit (354 mLs total) by mouth once for 1 dose. 354 mL 0   terbinafine (LAMISIL) 1 % cream Apply 1 Application topically 2 (two) times daily. 30 g 0   No current facility-administered medications for this visit.    Patient confirms/reports the following allergies:  No Known Allergies  No orders of the defined types were placed in this encounter.   AUTHORIZATION INFORMATION Primary Insurance: 1D#: Group #:  Secondary Insurance: 1D#: Group #:  SCHEDULE INFORMATION: Date: 06/05/24 Time: Location: [facility]

## 2024-02-14 NOTE — Telephone Encounter (Signed)
 Gastroenterology Pre-Procedure Review  Request Date: 06/05/24 Requesting Physician: Dr. Ole Berkeley  PATIENT REVIEW QUESTIONS: The patient responded to the following health history questions as indicated:    1. Are you having any GI issues? no 2. Do you have a personal history of Polyps? yes (last colonoscopy performed by dr Ole Berkeley 06/03/19 recommended repeat in 5 years) 3. Do you have a family history of Colon Cancer or Polyps? no 4. Diabetes Mellitus? no 5. Joint replacements in the past 12 months?no 6. Major health problems in the past 3 months?no 7. Any artificial heart valves, MVP, or defibrillator?no    MEDICATIONS & ALLERGIES:    Patient reports the following regarding taking any anticoagulation/antiplatelet therapy:   Plavix, Coumadin, Eliquis, Xarelto, Lovenox, Pradaxa, Brilinta, or Effient? no Aspirin? no  Patient confirms/reports the following medications:  Current Outpatient Medications  Medication Sig Dispense Refill   Na Sulfate-K Sulfate-Mg Sulfate concentrate (SUPREP) 17.5-3.13-1.6 GM/177ML SOLN Take 1 kit (354 mLs total) by mouth once for 1 dose. 354 mL 0   atorvastatin  (LIPITOR) 40 MG tablet Take 1 tablet (40 mg total) by mouth daily. 90 tablet 3   ketoconazole  (NIZORAL ) 2 % cream Apply 1 Application topically daily. For two weeks 60 g 0   Krill Oil 500 MG CAPS Take 2 capsules by mouth daily.     terbinafine (LAMISIL) 1 % cream Apply 1 Application topically 2 (two) times daily. 30 g 0   No current facility-administered medications for this visit.    Patient confirms/reports the following allergies:  No Known Allergies  No orders of the defined types were placed in this encounter.   AUTHORIZATION INFORMATION Primary Insurance: 1D#: Group #:  Secondary Insurance: 1D#: Group #:  SCHEDULE INFORMATION: Date: 06/05/24 Time: Location: [facility]

## 2024-02-14 NOTE — Consult Note (Signed)
 Cardiology Consult    Patient ID: Caleb Lopez MRN: [id number], DOB/AGE: 12/09/1960   Admit date: 02/14/2024 Date of Consult: 02/14/2024  Primary Physician: Carlean Charter, DO Primary Cardiologist: Antionette Kirks, MD Requesting Provider: B. Dahal, MD  Patient Profile    Caleb Lopez is a 63 y.o. male with a history of nonobstructive CAD, hyperlipidemia, morbid obesity, untreated OSA, borderline diabetes, and right bundle branch block, who is being seen today for the evaluation of rapid atrial flutter at the request of Dr. Gwynneth Lessen.  Past Medical History   Subjective  Past Medical History:  Diagnosis Date   Borderline diabetes    CAD (coronary artery disease)    a. 07/2008 NSTEMI/Cath ([facility]): LM nl, LAD 60, LCX nl, RCA nl, EF 55%->Med rx. Presentation felt to be secondary to pericarditis.   Morbid obesity (HCC)    Pericarditis 07/2008   RBBB     Past Surgical History:  Procedure Laterality Date   COLONOSCOPY WITH PROPOFOL  N/A 06/03/2019   Procedure: COLONOSCOPY WITH PROPOFOL ;  Surgeon: Marnee Sink, MD;  Location: [facility] ENDOSCOPY;  Service: Endoscopy;  Laterality: N/A;   THORACENTESIS  1980'S   x's 2 probably infection   TONSILLECTOMY AND ADENOIDECTOMY  1960'S     Allergies  No Known Allergies     History of Present Illness   63 y.o. male with a history of nonobstructive CAD, hyperlipidemia, morbid obesity, untreated OSA, borderline diabetes, and right bundle branch block.  He previously underwent diagnostic catheterization at [facility] in 2009 in the setting of non-STEMI.  Catheterization showed a 60 % LAD stenosis and otherwise normal coronary arteries.  Per documentation, presentation was subsequently felt to be consistent with pericarditis.  He has not required cardiology follow-up since.  He is treated for HL and has been told that he has sleep apnea, but does not use CPAP.  He was in his USOH until ~ 2-3 wks ago, when he suddenly felt pulsations in his neck consistent  with his HR being elevated.  His wife checked his HR and his rates were in the 150's.  This remained elevated for about 4 hrs prior to resolving spontaneously.  He and his wife has since been monitoring his HR more closely and they have continued to note periodic elevations in HR for hours at a time.  With the exception of noticing pulsations in his neck, he has not otherwise been symptomatic.  He arranged for a primary care f/u appt for this AM.  Prior to the appt, he was contacted by his PCP's office and he notified them that his rates were in the 140's to 160's.  He was advised to present to the ED.  On arrival, he was afebrile and normotensive.  ECG showed atrial flutter, 157, left axis deviation, inferior infarct, right bundle branch block.  Labs notable for mild BNP elevation at 252.4 with mild troponin elevation to 18.  Chest x-ray showed minimal left basilar subsegmental atelectasis or scarring.  He was given a 5 mg IV diltiazem bolus and infusion initiated.  He is also placed on heparin.  He remains in aflutter in the 130's to 150's but is currently asymptomatic.  Inpatient Medications   Subjective   IV diltiazem Heparin infusion   Family History    Family History  Problem Relation Age of Onset   Diverticulitis Mother    Arthritis Mother    Glaucoma Mother    Diabetes Father    He indicated that his mother is alive. He  indicated that his father is alive. He indicated that his sister is alive.   Social History    Social History   Socioeconomic History   Marital status: Married    Spouse name: Not on file   Number of children: Not on file   Years of education: Not on file   Highest education level: Not on file  Occupational History   Not on file  Tobacco Use   Smoking status: Former    Current packs/day: 0.00    Types: Cigarettes    Quit date: 09/11/1988    Years since quitting: 35.4   Smokeless tobacco: Never  Vaping Use   Vaping status: Never Used  Substance and  Sexual Activity   Alcohol use: Yes    Comment: occasional   Drug use: Never   Sexual activity: Not on file  Other Topics Concern   Not on file  Social History Narrative   Not on file   Social Drivers of Health   Financial Resource Strain: Not on file  Food Insecurity: Not on file  Transportation Needs: Not on file  Physical Activity: Not on file  Stress: Not on file  Social Connections: Not on file  Intimate Partner Violence: Not on file     Review of Systems    General:  No chills, fever, night sweats or weight changes.  Cardiovascular:  +++ palpitations.  No chest pain, dyspnea on exertion, +++ mild L LE edema, no orthopnea, paroxysmal nocturnal dyspnea. Dermatological: No rash, lesions/masses Respiratory: No cough, dyspnea Urologic: No hematuria, dysuria Abdominal:   No nausea, vomiting, diarrhea, bright red blood per rectum, melena, or hematemesis Neurologic:  No visual changes, wkns, changes in mental status. All other systems reviewed and are otherwise negative except as noted above.     Objective   Physical Exam    Blood pressure 108/87, pulse 83, temperature 98 F (36.7 C), temperature source Oral, resp. rate (!) 25, height 6' (1.829 m), weight 135.6 kg, SpO2 93%.  General: Pleasant, NAD Psych: Normal affect. Neuro: Alert and oriented X 3. Moves all extremities spontaneously. HEENT: Normal  Neck: Supple without bruits or JVD. Lungs:  Resp regular and unlabored, CTA. Heart: RRR, tachycardic, no s3, s4, or murmurs. Abdomen: Soft, non-tender, non-distended, BS + x 4.  Extremities: No clubbing, cyanosis or edema. DP/PT2+, Radials 2+ and equal bilaterally.  Labs    Cardiac Enzymes Recent Labs  Lab 02/14/24 1236  TROPONINIHS 18*     BNP    Component Value Date/Time   BNP 252.4 (H) 02/14/2024 1236   Lab Results  Component Value Date   WBC 8.6 02/14/2024   HGB 16.3 02/14/2024   HCT 49.5 02/14/2024   MCV 93.6 02/14/2024   PLT 260 02/14/2024     Recent Labs  Lab 02/14/24 1236  NA 138  K 4.1  CL 109  CO2 22  BUN 17  CREATININE 1.00  CALCIUM  9.4  GLUCOSE 124*   Lab Results  Component Value Date   CHOL 141 01/02/2024   HDL 38 (L) 01/02/2024   LDLCALC 80 01/02/2024   TRIG 128 01/02/2024      Radiology Studies    DG Chest Port 1 View Result Date: 02/14/2024 CLINICAL DATA:  Tachycardia. EXAM: PORTABLE CHEST 1 VIEW COMPARISON:  October 27, 2009. FINDINGS: Stable cardiomediastinal silhouette. Right lung is clear. Minimal left basilar subsegmental atelectasis or scarring is noted. Bony thorax is unremarkable. IMPRESSION: Minimal left basilar subsegmental atelectasis or scarring. Electronically Signed   By:  Rosalene Colon M.D.   On: 02/14/2024 13:31      ECG & Cardiac Imaging    atrial flutter, 157, left axis deviation, inferior infarct, right bundle branch block - personally reviewed.  Assessment & Plan    1.  Atrial flutter w/ RVR: Patient presented to the emergency department with a 1 month history of intermittent heart rate elevations which he experiences as rapid pulsations in his neck.  He is otherwise asymptomatic.  He and his wife have been checking his heart rate periodically over the past month and have frequently noted heart rates greater than 130 though sometimes, rate to be in the 70s.  He was scheduled to see his primary care provider today but after discussing his elevated heart rates, he was referred to the ED where he was found to be in atrial flutter with rapid ventricular response at 157 bpm.  He is currently asymptomatic.  On IV diltiazem, rates are currently trending in the 130s.  Up to this point, lab work is relatively unremarkable although BNP is mildly elevated at 252.4 with a troponin of 18.  Follow-up TSH.  Follow-up echo.  Continue diltiazem and heparin and plan for TEE and cardioversion early tomorrow afternoon with plan to transition to Eliquis.  Plan for outpatient EP evaluation for consideration of  ablation in the future.  2.  OSA: Previously diagnosed with sleep apnea though is unable to be comfortable with the mask.  He says he has been trying to lose weight and is a vegetarian for many years now.  Would likely benefit from initiation of GLP-1 agonist for weight loss and improvement in sleep apnea.  He plans to discuss with his primary care provider.  Could also consider outpatient ENT referral for inspire device.    3.  HL: Continue statin therapy.  4.  Elevated troponin/nonobstructive CAD: Mild troponin elevation of 18 in the setting of likely persistent tachycardia and atrial flutter.  Prior history of nonobstructive LAD disease by catheterization in 2009.  He does not experience chest pain or dyspnea at home.  Continue heparin.  Trend troponin.  Follow-up echo.  5.  Borderline diabetes mellitus: A1c recently 6.2.  Would benefit from GLP-1 agonist in the outpatient setting.  6.  Morbid obesity: Discussed the importance of weight management in preventing atrial arrhythmias in the future.  As above, would benefit from GLP-1 agonist in the outpatient setting.  Risk Assessment/Risk Scores:       CHA2DS2-VASc Score = 1   This indicates a 0.6% annual risk of stroke. The patient's score is based upon: CHF History: 0 HTN History: 0 Diabetes History: 0 Stroke History: 0 Vascular Disease History: 1 Age Score: 0 Gender Score: 0      Signed, Laneta Pintos, NP 02/14/2024, 3:34 PM  For questions or updates, please contact   Please consult [email or web address] for contact info under Cardiology/STEMI.

## 2024-02-14 NOTE — H&P (Signed)
 [facility] History and Physical  Caleb LEMMERMAN WUJ:[id number] DOB: August 27, 1961 DOA: 02/14/2024 PCP: Carlean Charter, DO  Presented from: Home Chief Complaint: Palpitation  History of Present Illness: Caleb Lopez is a 63 y.o. male with PMH significant for morbid obesity, untreated sleep apnea, prediabetes, HLD, nonobstructive CAD, BPH. Patient presented to the ED today with complaint of palpitation. Patient states that he had palpitations intermittently for the last 2 weeks without any associated chest pain, shortness of breath. Today he had an outpatient primary care visit.  He called them to notify that his heart rate is in 150s and hence he was instructed to come to the ED.  In the ED, patient was afebrile, heart rate in 150s, blood pressure 120, breathing on room air. Labs with CBC unremarkable, BNP 252 Chest x-ray unremarkable. EKG showed heart rate of 157, a flutter with 2 is to 1 block, left axis deviation, inferior infarct. Patient was given IV Cardizem bolus and started drip.  Also started on heparin drip EDP discussed with cardiologist Dr. Alvenia Aus.  Hospitalist service was  At the time of my evaluation, patient was propped up in bed.  Family at bedside. Patient was just seen by cardiology NP.  Noted a plan of TEE cardioversion tomorrow. History reviewed and detailed as above  Review of Systems:  All systems were reviewed and were negative unless otherwise mentioned in the HPI   Past medical history: Past Medical History:  Diagnosis Date   Borderline diabetes    CAD (coronary artery disease)    a. 07/2008 NSTEMI/Cath ([facility]): LM nl, LAD 60, LCX nl, RCA nl, EF 55%->Med rx. Presentation felt to be secondary to pericarditis.   Morbid obesity (HCC)    Pericarditis 07/2008   RBBB     Past surgical history: Past Surgical History:  Procedure Laterality Date   COLONOSCOPY WITH PROPOFOL  N/A 06/03/2019   Procedure: COLONOSCOPY WITH PROPOFOL ;  Surgeon: Marnee Sink,  MD;  Location: [facility] ENDOSCOPY;  Service: Endoscopy;  Laterality: N/A;   THORACENTESIS  1980'S   x's 2 probably infection   TONSILLECTOMY AND ADENOIDECTOMY  1960'S    Social History:  reports that he quit smoking about 35 years ago. His smoking use included cigarettes. He has never used smokeless tobacco. He reports current alcohol use. He reports that he does not use drugs.  Allergies:  No Known Allergies Patient has no known allergies.   Family history:  Family History  Problem Relation Age of Onset   Diverticulitis Mother    Arthritis Mother    Glaucoma Mother    Diabetes Father      Physical Exam: Vitals:   02/14/24 1300 02/14/24 1330 02/14/24 1500 02/14/24 1518  BP: 103/83 99/80 108/87   Pulse: (!) 155 (!) 153 96 83  Resp: (!) 37 11 (!) 23 (!) 25  Temp:      TempSrc:      SpO2: 97% 92% 95% 93%  Weight:      Height:       Wt Readings from Last 3 Encounters:  02/14/24 135.6 kg  01/02/24 135.6 kg  06/05/23 119.2 kg   Body mass index is 40.54 kg/m.  General exam: Pleasant, morbidly obese, middle-aged male Skin: No rashes, lesions or ulcers. HEENT: Atraumatic, normocephalic, no obvious bleeding Lungs: Clear to auscultation bilaterally,  CVS: S1, S2, no murmur,   GI/Abd: Soft, nontender, distended from obesity, bowel sound present,   CNS: Alert, awake, oriented x 3 Psychiatry: Mood appropriate,  Extremities: No pedal  edema, no calf tenderness,    ----------------------------------------------------------------------------------------------------------------------------------------- ----------------------------------------------------------------------------------------------------------------------------------------- -----------------------------------------------------------------------------------------------------------------------------------------  Assessment/Plan: A flutter with RVR Presented with 3 weeks of intermittent a flutter with symptoms of  palpitation Heart rate in 150s.  No other symptoms currently Started on Cardizem drip and heparin drip Seen by cardiology.  Noted plan of TEE cardioversion tomorrow Follow-up TSH Tentative plan of Eliquis at discharge  Elevated troponin  h/o nonobstructive CAD Mildly elevated troponin.  Likely rate related.  Continue to trend. Recent Labs    02/14/24 1236  TROPONINIHS 18*   Prediabetes A1c 6.2 in April 2025 Given his coexisting morbid obesity, patient will benefit from GLP-1 agonist as an outpatient  HLD Statin  Morbid Obesity  Body mass index is 40.54 kg/m. Patient has been advised to make an attempt to improve diet and exercise patterns to aid in weight loss.  OSA Previously diagnosed with sleep apnea.,  Could not tolerate mask. Could benefit from GLP-1 agonist or an Inspire device.  Mobility: Encourage ambulation  Goals of care:   Code Status: Full Code    DVT prophylaxis:     Antimicrobials: None Fluid: None Consultants: Cardiology Family Communication: Family at bedside  Status: Inpatient Level of care:  Progressive   Patient is from: Home Diet:  Diet Order             Diet NPO time specified  Diet effective midnight           Diet Heart Room service appropriate? Yes; Fluid consistency: Thin  Diet effective now                    ------------------------------------------------------------------------------------- Severity of Illness: The appropriate patient status for this patient is INPATIENT. Inpatient status is judged to be reasonable and necessary in order to provide the required intensity of service to ensure the patient's safety. The patient's presenting symptoms, physical exam findings, and initial radiographic and laboratory data in the context of their chronic comorbidities is felt to place them at high risk for further clinical deterioration. Furthermore, it is not anticipated that the patient will be medically stable for discharge from  the hospital within 2 midnights of admission.   * I certify that at the point of admission it is my clinical judgment that the patient will require inpatient hospital care spanning beyond 2 midnights from the point of admission due to high intensity of service, high risk for further deterioration and high frequency of surveillance required.* -------------------------------------------------------------------------------------   Home Meds: Prior to Admission medications   Medication Sig Start Date End Date Taking? Authorizing Provider  atorvastatin  (LIPITOR) 40 MG tablet Take 1 tablet (40 mg total) by mouth daily. 01/02/24   Carlean Charter, DO  ketoconazole  (NIZORAL ) 2 % cream Apply 1 Application topically daily. For two weeks 01/02/24   Carlean Charter, DO  Krill Oil 500 MG CAPS Take 2 capsules by mouth daily.    [provider]  Na Sulfate-K Sulfate-Mg Sulfate concentrate (SUPREP) 17.5-3.13-1.6 GM/177ML SOLN Take 1 kit (354 mLs total) by mouth once for 1 dose. 02/14/24 02/14/24  Marnee Sink, MD  terbinafine (LAMISIL) 1 % cream Apply 1 Application topically 2 (two) times daily. 02/13/24   Carlean Charter, DO    Labs on Admission:   CBC: Recent Labs  Lab 02/14/24 1236  WBC 8.6  NEUTROABS 5.0  HGB 16.3  HCT 49.5  MCV 93.6  PLT 260    Basic Metabolic Panel: Recent Labs  Lab 02/14/24 1236  NA 138  K 4.1  CL 109  CO2 22  GLUCOSE 124*  BUN 17  CREATININE 1.00  CALCIUM  9.4    Liver Function Tests: No results for input(s): "AST", "ALT", "ALKPHOS", "BILITOT", "PROT", "ALBUMIN" in the last 168 hours. No results for input(s): "LIPASE", "AMYLASE" in the last 168 hours. No results for input(s): "AMMONIA" in the last 168 hours.  Cardiac Enzymes: No results for input(s): "CKTOTAL", "CKMB", "CKMBINDEX", "TROPONINI" in the last 168 hours.  BNP (last 3 results) Recent Labs    02/14/24 1236  BNP 252.4*    ProBNP (last 3 results) No results for input(s): "PROBNP" in the  last 8760 hours.  CBG: No results for input(s): "GLUCAP" in the last 168 hours.  Lipase  No results found for: "LIPASE"   Urinalysis    Component Value Date/Time   BILIRUBINUR negative 06/19/2016 1308   PROTEINUR negative 06/19/2016 1308   UROBILINOGEN 0.2 06/19/2016 1308   NITRITE negative 06/19/2016 1308   LEUKOCYTESUR Negative 06/19/2016 1308     Drugs of Abuse  No results found for: "LABOPIA", "COCAINSCRNUR", "LABBENZ", "AMPHETMU", "THCU", "LABBARB"    Radiological Exams on Admission: DG Chest Port 1 View Result Date: 02/14/2024 CLINICAL DATA:  Tachycardia. EXAM: PORTABLE CHEST 1 VIEW COMPARISON:  October 27, 2009. FINDINGS: Stable cardiomediastinal silhouette. Right lung is clear. Minimal left basilar subsegmental atelectasis or scarring is noted. Bony thorax is unremarkable. IMPRESSION: Minimal left basilar subsegmental atelectasis or scarring. Electronically Signed   By: Rosalene Colon M.D.   On: 02/14/2024 13:31     Signed, Hoyt Macleod, MD [facility] 02/14/2024

## 2024-02-14 NOTE — Telephone Encounter (Signed)
 Patient Product/process development scientist completed.    The patient is insured through [facility]. Patient has [employer], may use a copay card, and/or apply for patient assistance if available.    Ran test claim for Eliquis 5 mg and the current 30 day co-pay is $60.00.  Ran test claim for Xarelto 20 mg and the current 30 day co-pay is $60.00.  This test claim was processed through [facility]- copay amounts may vary at other pharmacies due to pharmacy/plan contracts, or as the patient moves through the different stages of their insurance plan.     Morgan Arab, CPHT Pharmacy Technician III Certified Patient Advocate [facility] Patient Advocate Team Direct Number: [phone]  Fax: [phone]

## 2024-02-14 NOTE — Telephone Encounter (Signed)
 Pt is requesting a call back to schedule colonoscopy with Dr. Ole Berkeley

## 2024-02-14 NOTE — ED Provider Notes (Signed)
 [facility] Provider Note    Event Date/Time   First MD Initiated Contact with Patient 02/14/24 1228     (approximate)   History   Tachycardia   HPI  Caleb Lopez is a 63 y.o. male with a history of CAD, borderline diabetes, BPH, and obesity who presents with palpitations and elevated heart rate.  The patient states that he has had palpitations intermittently over the last 2 weeks but denies associated shortness of breath, lightheadedness, or chest pain.  Today he states that he had an outpatient primary care visit and was asked to check his vital signs at home on his wife's heart rate and blood pressure monitor.  That is when they noticed that the heart rate was in the 150s.  He was then instructed to come to the ED.  The patient states that he currently has minimal palpitations.  He has no chest pain, shortness of breath, or cough.  He reports very mild leg swelling.  I reviewed the past medical records.  The patient's most recent outpatient encounter was with Dr. Bary Likes from dermatology on 5/27.  Previously he was seen by primary care on 4/23 for follow-up of his chronic conditions as well as reporting some leg swelling at that time.   Physical Exam   Triage Vital Signs: ED Triage Vitals [02/14/24 1223]  Encounter Vitals Group     BP (!) 120/93     Systolic BP Percentile      Diastolic BP Percentile      Pulse Rate 79     Resp 18     Temp 98 F (36.7 C)     Temp Source Oral     SpO2 98 %     Weight 298 lb 15.1 oz (135.6 kg)     Height 6' (1.829 m)     Head Circumference      Peak Flow      Pain Score 0     Pain Loc      Pain Education      Exclude from Growth Chart     Most recent vital signs: Vitals:   02/14/24 1300 02/14/24 1330  BP: 103/83 99/80  Pulse: (!) 155 (!) 153  Resp: (!) 37 11  Temp:    SpO2: 97% 92%     General:  Alert, relatively well-appearing, no distress.  CV:  Good peripheral perfusion.  Tachycardic, regular  rhythm. Resp:  Normal effort.  Lungs CTAB. Abd:  No distention.  Other:  Trace bilateral lower extremity edema.   ED Results / Procedures / Treatments   Labs (all labs ordered are listed, but only abnormal results are displayed) Labs Reviewed  BRAIN NATRIURETIC PEPTIDE - Abnormal; Notable for the following components:      Result Value   B Natriuretic Peptide 252.4 (*)    All other components within normal limits  CBC WITH DIFFERENTIAL/PLATELET - Abnormal; Notable for the following components:   Monocytes Absolute 1.1 (*)    All other components within normal limits  BASIC METABOLIC PANEL WITH GFR - Abnormal; Notable for the following components:   Glucose, Bld 124 (*)    All other components within normal limits  TROPONIN I (HIGH SENSITIVITY) - Abnormal; Notable for the following components:   Troponin I (High Sensitivity) 18 (*)    All other components within normal limits  APTT  PROTIME-INR  HEPARIN LEVEL (UNFRACTIONATED)  TROPONIN I (HIGH SENSITIVITY)     EKG  ED ECG REPORT  ILind Repine, the attending physician, personally viewed and interpreted this ECG.  Date: 02/14/2024 EKG Time: 1224 Rate: 157 Rhythm: Sinus tachycardia QRS Axis: Left axis Intervals: RBBB ST/T Wave abnormalities: Nonspecific ST abnormalities, likely rate related Narrative Interpretation: no evidence of acute ischemia    RADIOLOGY  Chest x-ray: I independently viewed and interpreted the images; is no focal consolidation or edema   PROCEDURES:  Critical Care performed: Yes, see critical care procedure note(s)  .Critical Care  Performed by: Lind Repine, MD Authorized by: Lind Repine, MD   Critical care provider statement:    Critical care time (minutes):  30   Critical care time was exclusive of:  Separately billable procedures and treating other patients   Critical care was necessary to treat or prevent imminent or life-threatening deterioration of the  following conditions:  Cardiac failure   Critical care was time spent personally by me on the following activities:  Development of treatment plan with patient or surrogate, discussions with consultants, evaluation of patient's response to treatment, examination of patient, ordering and review of laboratory studies, ordering and review of radiographic studies, ordering and performing treatments and interventions, pulse oximetry, re-evaluation of patient's condition, review of old charts and obtaining history from patient or surrogate   Care discussed with: admitting provider      MEDICATIONS ORDERED IN ED: Medications  diltiazem (CARDIZEM) 125 mg in dextrose 5% 125 mL (1 mg/mL) infusion (10 mg/hr Intravenous Rate/Dose Change 02/14/24 1409)  heparin ADULT infusion 100 units/mL (25000 units/250mL) (1,600 Units/hr Intravenous New Bag/Given 02/14/24 1413)  acetaminophen  (TYLENOL ) tablet 650 mg (has no administration in time range)    Or  acetaminophen  (TYLENOL ) suppository 650 mg (has no administration in time range)  polyethylene glycol (MIRALAX / GLYCOLAX) packet 17 g (has no administration in time range)  bisacodyl (DULCOLAX) EC tablet 5 mg (has no administration in time range)  albuterol (PROVENTIL) (2.5 MG/3ML) 0.083% nebulizer solution 2.5 mg (has no administration in time range)  hydrALAZINE (APRESOLINE) injection 10 mg (has no administration in time range)  diltiazem (CARDIZEM) injection 5 mg (5 mg Intravenous Given 02/14/24 1246)  heparin bolus via infusion 6,000 Units (6,000 Units Intravenous Bolus from Bag 02/14/24 1413)     IMPRESSION / MDM / ASSESSMENT AND PLAN / ED COURSE  I reviewed the triage vital signs and the nursing notes.  63 year old male with PMH as noted above presents with intermittent palpitations over the last 2 weeks and a heart rate in the 150s.  He has no other significant symptoms except for very mild lower extremity edema.  Physical exam is otherwise unremarkable for  acute findings.  EKG is read by the machine as sinus tachycardia, however it appears to me to be more consistent with atrial flutter with 2:1 conduction.  This would also make more sense given the patient's symptoms and presentation.  We will give IV Cardizem, obtain lab workup, chest x-ray, and reassess.  Differential diagnosis includes, but is not limited to, atrial flutter, atrial fibrillation, sinus tachycardia, other tachydysrhythmia.  Patient's presentation is most consistent with acute presentation with potential threat to life or bodily function.  The patient is on the cardiac monitor to evaluate for evidence of arrhythmia and/or significant heart rate changes.  ----------------------------------------- 2:09 PM on 02/14/2024 -----------------------------------------  There was no response to the IV Cardizem dose.  I consulted Dr. Alvenia Aus from cardiology who reviewed the EKG.  He agrees that this appears to be atrial flutter with 2:1 conduction.  He recommends putting  the patient on a Cardizem infusion as well as on a heparin infusion given that he may need cardioversion.  The patient's heart rate is now somewhat improved ranging from the 120s to 140s.  Lab workup is overall unremarkable.  Troponin is borderline elevated.  BNP is also borderline elevated.  CBC and BMP show no acute findings.  I consulted Dr. Gwynneth Lessen from the hospitalist service; based on our discussion he agrees to evaluate the patient for admission.  FINAL CLINICAL IMPRESSION(S) / ED DIAGNOSES   Final diagnoses:  Atrial flutter, unspecified type (HCC)     Rx / DC Orders   ED Discharge Orders     None        Note:  This document was prepared using Dragon voice recognition software and may include unintentional dictation errors.    Lind Repine, MD 02/14/24 [id number]

## 2024-02-14 NOTE — H&P (View-Only) (Signed)
   Informed Consent   Shared Decision Making/Informed Consent   The risks [stroke, cardiac arrhythmias rarely resulting in the need for a temporary or permanent pacemaker, skin irritation or burns, esophageal damage, perforation (1:10,000 risk), bleeding, pharyngeal hematoma as well as other potential complications associated with conscious sedation including aspiration, arrhythmia, respiratory failure and death], benefits (treatment guidance, restoration of normal sinus rhythm, diagnostic support) and alternatives of a transesophageal echocardiogram guided cardioversion were discussed in detail with Mr. Kowalski and he is willing to proceed.  Laneta Pintos, NP 02/14/2024, 4:33 PM

## 2024-02-14 NOTE — Progress Notes (Signed)
   Informed Consent   Shared Decision Making/Informed Consent   The risks [stroke, cardiac arrhythmias rarely resulting in the need for a temporary or permanent pacemaker, skin irritation or burns, esophageal damage, perforation (1:10,000 risk), bleeding, pharyngeal hematoma as well as other potential complications associated with conscious sedation including aspiration, arrhythmia, respiratory failure and death], benefits (treatment guidance, restoration of normal sinus rhythm, diagnostic support) and alternatives of a transesophageal echocardiogram guided cardioversion were discussed in detail with Caleb Lopez and he is willing to proceed.  Laneta Pintos, NP 02/14/2024, 4:33 PM

## 2024-02-14 NOTE — Consult Note (Signed)
 Pharmacy Consult Note - Anticoagulation  Pharmacy Consult for heparin Indication: atrial fibrillation  PATIENT MEASUREMENTS: Height: 6' (182.9 cm) Weight: 135.6 kg (298 lb 15.1 oz) IBW/kg (Calculated) : 77.6 HEPARIN DW (KG): 108.6  VITAL SIGNS: Temp: 98 F (36.7 C) (06/05 1223) Temp Source: Oral (06/05 1223) BP: 99/80 (06/05 1330) Pulse Rate: 153 (06/05 1330)  Recent Labs    02/14/24 1236  HGB 16.3  HCT 49.5  PLT 260  APTT 29  LABPROT 13.5  INR 1.0  CREATININE 1.00  TROPONINIHS 18*    Estimated Creatinine Clearance: 106.4 mL/min (by C-G formula based on SCr of 1 mg/dL).  PAST MEDICAL HISTORY: Past Medical History:  Diagnosis Date   CAD (coronary artery disease)    a. 07/2008 NSTEMI/Cath ([facility]): LM nl, LAD 60, LCX nl, RCA nl, EF 55%->Med rx. Presentation felt to be secondary to pericarditis.   Pericarditis 07/2008   RBBB     ASSESSMENT: 63 y.o. male with PMH including CAD, HLD, pericarditis, obesity is presenting with symptomatic atrial fibrillation. He endorses anxiety, fuzzyheadedness, chest pressure, and there is leg swelling as well. HR currently in the 150s. Patient is not on chronic anticoagulation per chart review. CHA2DS2VASc is at least 2 (HTN, CAD). Pharmacy has been consulted to initiate and manage heparin intravenous infusion.  Pertinent medications: No chronic anticoag prior to admission  Goal(s) of therapy: Heparin level 0.3 - 0.7 units/mL Monitor platelets by anticoagulation protocol: Yes   Baseline anticoagulation labs: Recent Labs    02/14/24 1236  APTT 29  INR 1.0  HGB 16.3  PLT 260       PLAN: Give 6000 units bolus x1; then start heparin infusion at 1600 units/hour. Check heparin level in 6 hours, then daily once at least two levels are consecutively therapeutic. Monitor CBC daily while on heparin infusion.   Will M. Alva Jewels, PharmD Clinical Pharmacist 02/14/2024 2:18 PM

## 2024-02-15 ENCOUNTER — Encounter
Admission: EM | Disposition: A | Discharge status: Home / Self Care | Payer: Self-pay | Source: Home / Self Care | Attending: Emergency Medicine

## 2024-02-15 ENCOUNTER — Inpatient Hospital Stay: Admitting: Anesthesiology

## 2024-02-15 ENCOUNTER — Inpatient Hospital Stay
Admit: 2024-02-15 | Discharge: 2024-02-15 | Disposition: A | Discharge status: Home / Self Care | Attending: Nurse Practitioner | Admitting: Nurse Practitioner

## 2024-02-15 ENCOUNTER — Other Ambulatory Visit: Payer: Self-pay

## 2024-02-15 DIAGNOSIS — I4510 Unspecified right bundle-branch block: Secondary | ICD-10-CM | POA: Diagnosis not present

## 2024-02-15 DIAGNOSIS — I4892 Unspecified atrial flutter: Secondary | ICD-10-CM

## 2024-02-15 DIAGNOSIS — G4733 Obstructive sleep apnea (adult) (pediatric): Secondary | ICD-10-CM | POA: Diagnosis not present

## 2024-02-15 DIAGNOSIS — E785 Hyperlipidemia, unspecified: Secondary | ICD-10-CM

## 2024-02-15 HISTORY — PX: CARDIOVERSION: SHX1299

## 2024-02-15 HISTORY — PX: TEE WITHOUT CARDIOVERSION: SHX5443

## 2024-02-15 LAB — CBC
HCT: 45 % (ref 39.0–52.0)
Hemoglobin: 15.2 g/dL (ref 13.0–17.0)
MCH: 31.5 pg (ref 26.0–34.0)
MCHC: 33.8 g/dL (ref 30.0–36.0)
MCV: 93.2 fL (ref 80.0–100.0)
Platelets: 235 10*3/uL (ref 150–400)
RBC: 4.83 MIL/uL (ref 4.22–5.81)
RDW: 13 % (ref 11.5–15.5)
WBC: 7.9 10*3/uL (ref 4.0–10.5)
nRBC: 0 % (ref 0.0–0.2)

## 2024-02-15 LAB — HEPARIN LEVEL (UNFRACTIONATED)
Heparin Unfractionated: 0.28 [IU]/mL — ABNORMAL LOW (ref 0.30–0.70)
Heparin Unfractionated: 0.4 [IU]/mL (ref 0.30–0.70)
Heparin Unfractionated: 0.32 [IU]/mL (ref 0.30–0.70)

## 2024-02-15 LAB — BASIC METABOLIC PANEL WITH GFR
Anion gap: 6 (ref 5–15)
BUN: 16 mg/dL (ref 8–23)
CO2: 23 mmol/L (ref 22–32)
Calcium: 9.3 mg/dL (ref 8.9–10.3)
Chloride: 109 mmol/L (ref 98–111)
Creatinine, Ser: 0.8 mg/dL (ref 0.61–1.24)
GFR, Estimated: >60 mL/min (ref >60)
Glucose, Bld: 114 mg/dL — ABNORMAL HIGH (ref 70–99)
Potassium: 4.1 mmol/L (ref 3.5–5.1)
Sodium: 138 mmol/L (ref 135–145)

## 2024-02-15 LAB — ECHO TEE

## 2024-02-15 LAB — HEPATIC FUNCTION PANEL
ALT: 22 U/L (ref 0–44)
AST: 19 U/L (ref 15–41)
Albumin: 3.5 g/dL (ref 3.5–5.0)
Alkaline Phosphatase: 70 U/L (ref 38–126)
Bilirubin, Direct: <0.1 mg/dL (ref 0.0–0.2)
Total Bilirubin: 0.7 mg/dL (ref 0.0–1.2)
Total Protein: 6.2 g/dL — ABNORMAL LOW (ref 6.5–8.1)

## 2024-02-15 LAB — TSH: TSH: 2.576 u[IU]/mL (ref 0.350–4.500)

## 2024-02-15 LAB — HIV ANTIBODY (ROUTINE TESTING W REFLEX): HIV Screen 4th Generation wRfx: NONREACTIVE

## 2024-02-15 SURGERY — ECHOCARDIOGRAM, TRANSESOPHAGEAL
Anesthesia: General

## 2024-02-15 MED ORDER — APIXABAN 5 MG PO TABS
5.0000 mg | ORAL_TABLET | Freq: Two times a day (BID) | ORAL | Status: DC
  Administered 2024-02-15: 5 mg via ORAL
  Filled 2024-02-15 (×2): qty 1

## 2024-02-15 MED ORDER — LIDOCAINE HCL (CARDIAC) PF 100 MG/5ML IV SOSY
PREFILLED_SYRINGE | INTRAVENOUS | Status: DC | PRN
Start: 2024-02-15 — End: 2024-02-15
  Administered 2024-02-15: 60 mg via INTRAVENOUS

## 2024-02-15 MED ORDER — LIDOCAINE VISCOUS HCL 2 % MT SOLN
OROMUCOSAL | Status: AC
  Filled 2024-02-15: qty 15

## 2024-02-15 MED ORDER — KETAMINE HCL 50 MG/5ML IJ SOSY
PREFILLED_SYRINGE | INTRAMUSCULAR | Status: AC
  Filled 2024-02-15: qty 5

## 2024-02-15 MED ORDER — AMIODARONE HCL 200 MG PO TABS: 200.0000 mg | ORAL_TABLET | Freq: Every day | ORAL | Status: DC

## 2024-02-15 MED ORDER — AMIODARONE HCL 200 MG PO TABS: 400.0000 mg | ORAL_TABLET | Freq: Two times a day (BID) | ORAL | Status: DC

## 2024-02-15 MED ORDER — PROPOFOL 500 MG/50ML IV EMUL
INTRAVENOUS | Status: DC | PRN
  Administered 2024-02-15: 50 mg via INTRAVENOUS
  Administered 2024-02-15: 150 mg via INTRAVENOUS

## 2024-02-15 MED ORDER — EPHEDRINE 5 MG/ML INJ
INTRAVENOUS | Status: AC
  Filled 2024-02-15: qty 5

## 2024-02-15 MED ORDER — AMIODARONE HCL 200 MG PO TABS
400.0000 mg | ORAL_TABLET | Freq: Once | ORAL | Status: AC
  Administered 2024-02-15: 400 mg via ORAL
  Filled 2024-02-15: qty 2

## 2024-02-15 MED ORDER — AMIODARONE HCL 200 MG PO TABS: ORAL_TABLET | ORAL | 0 refills | Status: DC

## 2024-02-15 MED ORDER — PHENYLEPHRINE HCL (PRESSORS) 10 MG/ML IV SOLN
INTRAVENOUS | Status: AC
Start: 2024-02-15 — End: ?
  Filled 2024-02-15: qty 1

## 2024-02-15 MED ORDER — APIXABAN 5 MG PO TABS: 5.0000 mg | ORAL_TABLET | Freq: Two times a day (BID) | ORAL | 0 refills | Status: DC

## 2024-02-15 MED ORDER — ONDANSETRON HCL 4 MG/2ML IJ SOLN: 4.0000 mg | Freq: Once | INTRAMUSCULAR | Status: DC | PRN

## 2024-02-15 MED ORDER — AMIODARONE HCL 200 MG PO TABS
ORAL_TABLET | ORAL | 0 refills | Status: DC
  Filled 2024-02-15: qty 58, 30d supply, fill #0
  Filled 2024-03-10: qty 14, 14d supply, fill #1

## 2024-02-15 MED ORDER — PROPOFOL 10 MG/ML IV BOLUS
INTRAVENOUS | Status: AC
  Filled 2024-02-15: qty 40

## 2024-02-15 MED ORDER — AMIODARONE HCL 200 MG PO TABS: 200.0000 mg | ORAL_TABLET | Freq: Two times a day (BID) | ORAL | Status: DC

## 2024-02-15 MED ORDER — APIXABAN 5 MG PO TABS
5.0000 mg | ORAL_TABLET | Freq: Two times a day (BID) | ORAL | 0 refills | Status: DC
  Filled 2024-02-15: qty 60, 30d supply, fill #0

## 2024-02-15 MED ORDER — HEPARIN BOLUS VIA INFUSION
1600.0000 [IU] | Freq: Once | INTRAVENOUS | Status: AC
  Administered 2024-02-15: 1600 [IU] via INTRAVENOUS
  Filled 2024-02-15: qty 1600

## 2024-02-15 MED ORDER — BUTAMBEN-TETRACAINE-BENZOCAINE 2-2-14 % EX AERO
INHALATION_SPRAY | CUTANEOUS | Status: AC
  Filled 2024-02-15: qty 5

## 2024-02-15 MED ORDER — SODIUM CHLORIDE 0.9 % IV SOLN: INTRAVENOUS | Status: DC | PRN

## 2024-02-15 MED ORDER — MIDAZOLAM HCL 2 MG/2ML IJ SOLN
INTRAMUSCULAR | Status: AC
  Filled 2024-02-15: qty 2

## 2024-02-15 MED ORDER — LIDOCAINE HCL (PF) 2 % IJ SOLN
INTRAMUSCULAR | Status: AC
  Filled 2024-02-15: qty 5

## 2024-02-15 NOTE — Progress Notes (Signed)
*  PRELIMINARY RESULTS* Echocardiogram Echocardiogram Transesophageal has been performed.  Broadus Canes 02/15/2024, 12:54 PM

## 2024-02-15 NOTE — TOC CM/SW Note (Signed)
 Transition of Care [facility]) - Inpatient Brief Assessment   Patient Details  Name: Caleb Lopez MRN: [id number] Date of Birth: August 06, 1961  Transition of [facility]) CM/SW Contact:    Odilia Bennett, LCSW Phone Number: 02/15/2024, 3:16 PM   Clinical Narrative: CSW reviewed chart. No TOC needs identified so far. CSW will continue to follow progress. Please place [facility] consult if any needs arise.  Transition of Care Asessment: Insurance and Status: Insurance coverage has been reviewed Patient has primary care physician: Yes Home environment has been reviewed: Single family ohme Prior level of function:: Not documented Prior/Current Home Services: No current home services Social Drivers of Health Review: SDOH reviewed no interventions necessary Readmission risk has been reviewed: Yes Transition of care needs: no transition of care needs at this time

## 2024-02-15 NOTE — Anesthesia Preprocedure Evaluation (Addendum)
 Anesthesia Evaluation  Patient identified by MRN, date of birth, ID band Patient awake    Reviewed: Allergy & Precautions, NPO status , Patient's Chart, lab work & pertinent test results  History of Anesthesia Complications Negative for: history of anesthetic complications  Airway Mallampati: I   Neck ROM: Full    Dental no notable dental hx.    Pulmonary sleep apnea , former smoker (quit 1990)   Pulmonary exam normal breath sounds clear to auscultation       Cardiovascular + CAD (s/p pericarditis and NSTEMI 2009)   Rhythm:Regular Rate:Tachycardia     Neuro/Psych negative neurological ROS     GI/Hepatic negative GI ROS,,,  Endo/Other    Class 3 obesityPrediabetes   Renal/GU negative Renal ROS     Musculoskeletal   Abdominal   Peds  Hematology negative hematology ROS (+)   Anesthesia Other Findings   Reproductive/Obstetrics                             Anesthesia Physical Anesthesia Plan  ASA: 3  Anesthesia Plan: General   Post-op Pain Management:    Induction: Intravenous  PONV Risk Score and Plan: 2 and Propofol  infusion, TIVA and Treatment may vary due to age or medical condition  Airway Management Planned: Natural Airway  Additional Equipment:   Intra-op Plan:   Post-operative Plan:   Informed Consent: I have reviewed the patients History and Physical, chart, labs and discussed the procedure including the risks, benefits and alternatives for the proposed anesthesia with the patient or authorized representative who has indicated his/her understanding and acceptance.       Plan Discussed with: CRNA  Anesthesia Plan Comments: (LMA/GETA backup discussed.  Patient consented for risks of anesthesia including but not limited to:  - adverse reactions to medications - damage to eyes, teeth, lips or other oral mucosa - nerve damage due to positioning  - sore throat or  hoarseness - damage to heart, brain, nerves, lungs, other parts of body or loss of life  Informed patient about role of CRNA in peri- and intra-operative care.  Patient voiced understanding.)       Anesthesia Quick Evaluation

## 2024-02-15 NOTE — Interval H&P Note (Signed)
 History and Physical Interval Note:  02/15/2024 12:31 PM  Caleb Lopez  has presented today for surgery, with the diagnosis of atrial flutter.  The various methods of treatment have been discussed with the patient and family. After consideration of risks, benefits and other options for treatment, the patient has consented to  Procedure(s): ECHOCARDIOGRAM, TRANSESOPHAGEAL (N/A) CARDIOVERSION (N/A) as a surgical intervention.  The patient's history has been reviewed, patient examined, no change in status, stable for surgery.  I have reviewed the patient's chart and labs.  Questions were answered to the patient's satisfaction.     Brandee Markin

## 2024-02-15 NOTE — Progress Notes (Signed)
 Cardiology Progress Note   Patient Name: Caleb Lopez Date of Encounter: 02/15/2024  Primary Cardiologist: Antionette Kirks, MD  Subjective   Didn't sleep much last night - on night shift sleep schedule.  No c/p, dyspnea, or palpitations.  For TEE/DCCV this afternoon.  Questions answered. Objective   Inpatient Medications    Scheduled Meds:  atorvastatin   40 mg Oral Daily   sodium chloride  flush  3-10 mL Intravenous Q12H   Continuous Infusions:  diltiazem (CARDIZEM) infusion 7.5 mg/hr (02/15/24 0843)   heparin 1,800 Units/hr (02/15/24 0324)   PRN Meds: acetaminophen  **OR** acetaminophen , albuterol, bisacodyl, hydrALAZINE, polyethylene glycol, sodium chloride  flush   Vital Signs    Vitals:   02/15/24 0322 02/15/24 0400 02/15/24 0500 02/15/24 0737  BP:  105/73 109/61   Pulse: 90   62  Resp: 13  10   Temp: 97.6 F (36.4 C)   97.8 F (36.6 C)  TempSrc: Oral   Oral  SpO2: 96%   96%  Weight:      Height:        Intake/Output Summary (Last 24 hours) at 02/15/2024 0915 Last data filed at 02/15/2024 0325 Gross per 24 hour  Intake --  Output 1100 ml  Net -1100 ml   Filed Weights   02/14/24 1223  Weight: 135.6 kg    Physical Exam   GEN: Well nourished, well developed, in no acute distress.  HEENT: Grossly normal.  Neck: Supple, no JVD, carotid bruits, or masses. Cardiac: IR, IR, distant, no murmurs, rubs, or gallops. No clubbing, cyanosis, edema.  Radials 2+, DP/PT 2+ and equal bilaterally.  Respiratory:  Respirations regular and unlabored, clear to auscultation bilaterally. GI: Soft, nontender, nondistended, BS + x 4. MS: no deformity or atrophy. Skin: warm and dry, no rash. Neuro:  Strength and sensation are intact. Psych: AAOx3.  Normal affect.  Labs    Chemistry Recent Labs  Lab 02/14/24 1236 02/15/24 0230  NA 138 138  K 4.1 4.1  CL 109 109  CO2 22 23  GLUCOSE 124* 114*  BUN 17 16  CREATININE 1.00 0.80  CALCIUM  9.4 9.3  GFRNONAA >60 >60   ANIONGAP 7 6     Hematology Recent Labs  Lab 02/14/24 1236 02/15/24 0230  WBC 8.6 7.9  RBC 5.29 4.83  HGB 16.3 15.2  HCT 49.5 45.0  MCV 93.6 93.2  MCH 30.8 31.5  MCHC 32.9 33.8  RDW 12.9 13.0  PLT 260 235    Cardiac Enzymes  Recent Labs  Lab 02/14/24 1236 02/14/24 1603  TROPONINIHS 18* 13      BNP    Component Value Date/Time   BNP 252.4 (H) 02/14/2024 1236    Lipids  Lab Results  Component Value Date   CHOL 141 01/02/2024   HDL 38 (L) 01/02/2024   LDLCALC 80 01/02/2024   TRIG 128 01/02/2024   CHOLHDL 3.7 01/02/2024    HbA1c  Lab Results  Component Value Date   HGBA1C 6.2 (H) 01/02/2024    Radiology    DG Chest Port 1 View Result Date: 02/14/2024 CLINICAL DATA:  Tachycardia. EXAM: PORTABLE CHEST 1 VIEW COMPARISON:  October 27, 2009. FINDINGS: Stable cardiomediastinal silhouette. Right lung is clear. Minimal left basilar subsegmental atelectasis or scarring is noted. Bony thorax is unremarkable. IMPRESSION: Minimal left basilar subsegmental atelectasis or scarring. Electronically Signed   By: Rosalene Colon M.D.   On: 02/14/2024 13:31     Telemetry    Aflutter w/ variable conduction -  -  Personally Reviewed  Cardiac Studies   TEE pending  Patient Profile     63 y.o. male with a history of nonobstructive CAD, hyperlipidemia, morbid obesity, untreated OSA, borderline diabetes, and right bundle branch block, who was admitted 6/5 w/ a 4 wk h/o intermittent tachycardia and finding of atrial flutter w/ RVR.  Assessment & Plan    1.  Atrial flutter w/ RVR:  presented w/ a 4 wk h/o elevated HRs.  Wife said sometimes rates in 70's or 80's but freq > 130.  Found to be in rapid aflutter.  Rates better 90's to low 1-teens, variable conduction.  On IV dilt @ 7.5 - pressure soft and limiting titration.  Received a dose of IV digoxin yesterday afternoon.  Remains asymptomatic.  Cont heparin.  For TEE/DCCV this afternoon.  Questions answered.  Will need outpt EP  f/u and consideration for RFCA.  2.  OSA:  Doesn't use CPAP.  May benefit from inspire device.  Will def benefit from wt loss - Plan GLP1 agonist as outpt if not cost prohibitive.  3.  HL:  Cont statin.  LDL 80 earlier this year.  4.  Elevated troponin:  Minimal trop elevation in the setting of rapid atrial flutter - 18  13.  No c/p.  On heparin for flutter.  Echo/TEE pending.  4.  Borderline DM:  A1c 6.2.  Would benefit from GLP-1 agonist as outpt.  5.  Morbid obesity:  Vegetarian x many years but notes that he's never been able to lose wt.  Discussed avoidance of processed vegetarian options, as these are often still high calorie.  Would benefit from GLP1 agonist.  Signed, Laneta Pintos, NP  02/15/2024, 9:15 AM    For questions or updates, please contact   Please consult [email or web address] for contact info under Cardiology/STEMI.

## 2024-02-15 NOTE — Consult Note (Addendum)
 Pharmacy Consult Note - Anticoagulation  Pharmacy Consult for heparin Indication: atrial fibrillation  PATIENT MEASUREMENTS: Height: 6' (182.9 cm) Weight: 135.6 kg (298 lb 15.1 oz) IBW/kg (Calculated) : 77.6 HEPARIN DW (KG): 108.6  VITAL SIGNS: Temp: 97.8 F (36.6 C) (06/06 0737) Temp Source: Oral (06/06 0737) BP: 109/61 (06/06 0500) Pulse Rate: 62 (06/06 0737)  Recent Labs    02/14/24 1236 02/14/24 1603 02/14/24 2026 02/15/24 0230 02/15/24 0945  HGB 16.3  --   --  15.2  --   HCT 49.5  --   --  45.0  --   PLT 260  --   --  235  --   APTT 29  --   --   --   --   LABPROT 13.5  --   --   --   --   INR 1.0  --   --   --   --   HEPARINUNFRC  --   --    < > 0.28* 0.40  CREATININE 1.00  --   --  0.80  --   TROPONINIHS 18* 13  --   --   --    < > = values in this interval not displayed.    Estimated Creatinine Clearance: 133 mL/min (by C-G formula based on SCr of 0.8 mg/dL).  PAST MEDICAL HISTORY: Past Medical History:  Diagnosis Date   Borderline diabetes    CAD (coronary artery disease)    a. 07/2008 NSTEMI/Cath ([facility]): LM nl, LAD 60, LCX nl, RCA nl, EF 55%->Med rx. Presentation felt to be secondary to pericarditis.   Morbid obesity (HCC)    Pericarditis 07/2008   RBBB     ASSESSMENT: 63 y.o. male with PMH including CAD, HLD, pericarditis, obesity is presenting with symptomatic atrial fibrillation. He endorses anxiety, fuzzyheadedness, chest pressure, and there is leg swelling as well. HR currently in the 150s. Patient is not on chronic anticoagulation per chart review. CHA2DS2VASc is at least 2 (HTN, CAD). Pharmacy has been consulted to initiate and manage heparin intravenous infusion.  Pertinent medications: No chronic anticoag prior to admission  Goal(s) of therapy: Heparin level 0.3 - 0.7 units/mL Monitor platelets by anticoagulation protocol: Yes   Baseline anticoagulation labs: Recent Labs    02/14/24 1236 02/15/24 0230  APTT 29  --   INR 1.0  --    HGB 16.3 15.2  PLT 260 235    Date/Time Heparin Level Rate   06/05 @ 2026 0.37  1600 units/hr   06/06 @ 0230  0.28  1600 units/hr -->1800  06/06 @ 0945 0.40  1800 units/hour        PLAN: Continue heparin at current rate of 1800 units/hr Recheck HL 6 hrs to confirm therapeutic rate Continue to monitor HL and CBC daily while on heparin infusion.  Nai Dasch Rodriguez-Guzman PharmD, BCPS 02/15/2024 12:59 PM

## 2024-02-15 NOTE — Plan of Care (Signed)

## 2024-02-15 NOTE — Anesthesia Postprocedure Evaluation (Signed)
 Anesthesia Post Note  Patient: Willman Cuny Mallari  Procedure(s) Performed: ECHOCARDIOGRAM, TRANSESOPHAGEAL CARDIOVERSION  Patient location during evaluation: PACU Anesthesia Type: General Level of consciousness: awake and alert, oriented and patient cooperative Pain management: pain level controlled Vital Signs Assessment: post-procedure vital signs reviewed and stable Respiratory status: spontaneous breathing, nonlabored ventilation and respiratory function stable Cardiovascular status: blood pressure returned to baseline and stable Postop Assessment: adequate PO intake Anesthetic complications: no   No notable events documented.   Last Vitals:  Vitals:   02/15/24 1300 02/15/24 1315  BP: 112/76 111/63  Pulse:  70  Resp: 14 (!) 25  Temp:    SpO2:  94%    Last Pain:  Vitals:   02/15/24 1300  TempSrc:   PainSc: 0-No pain                 Dorothey Gate

## 2024-02-15 NOTE — Transfer of Care (Signed)
 Immediate Anesthesia Transfer of Care Note  Patient: Caleb Lopez  Procedure(s) Performed: ECHOCARDIOGRAM, TRANSESOPHAGEAL CARDIOVERSION  Patient Location: PACU  Anesthesia Type:MAC  Level of Consciousness: awake and alert   Airway & Oxygen Therapy: Patient Spontanous Breathing and Patient connected to nasal cannula oxygen  Post-op Assessment: Report given to RN and Post -op Vital signs reviewed and stable  Post vital signs: stable  Last Vitals:  Vitals Value Taken Time  BP 112/81   Temp    Pulse 84   Resp 16   SpO2 96     Last Pain:  Vitals:   02/15/24 1127  TempSrc:   PainSc: 0-No pain         Complications: No notable events documented.

## 2024-02-15 NOTE — Consult Note (Signed)
 Pharmacy Consult Note - Anticoagulation  Pharmacy Consult for heparin Indication: atrial fibrillation  PATIENT MEASUREMENTS: Height: 6' (182.9 cm) Weight: 135.6 kg (298 lb 15.1 oz) IBW/kg (Calculated) : 77.6 HEPARIN DW (KG): 108.6  VITAL SIGNS: Temp: 97.9 F (36.6 C) (06/05 2300) Temp Source: Oral (06/05 2300) BP: 91/60 (06/05 2300) Pulse Rate: 79 (06/05 2001)  Recent Labs    02/14/24 1236 02/14/24 1603 02/14/24 2026 02/15/24 0230  HGB 16.3  --   --  15.2  HCT 49.5  --   --  45.0  PLT 260  --   --  235  APTT 29  --   --   --   LABPROT 13.5  --   --   --   INR 1.0  --   --   --   HEPARINUNFRC  --   --    < > 0.28*  CREATININE 1.00  --   --  0.80  TROPONINIHS 18* 13  --   --    < > = values in this interval not displayed.    Estimated Creatinine Clearance: 133 mL/min (by C-G formula based on SCr of 0.8 mg/dL).  PAST MEDICAL HISTORY: Past Medical History:  Diagnosis Date   Borderline diabetes    CAD (coronary artery disease)    a. 07/2008 NSTEMI/Cath ([facility]): LM nl, LAD 60, LCX nl, RCA nl, EF 55%->Med rx. Presentation felt to be secondary to pericarditis.   Morbid obesity (HCC)    Pericarditis 07/2008   RBBB     ASSESSMENT: 63 y.o. male with PMH including CAD, HLD, pericarditis, obesity is presenting with symptomatic atrial fibrillation. He endorses anxiety, fuzzyheadedness, chest pressure, and there is leg swelling as well. HR currently in the 150s. Patient is not on chronic anticoagulation per chart review. CHA2DS2VASc is at least 2 (HTN, CAD). Pharmacy has been consulted to initiate and manage heparin intravenous infusion.  Pertinent medications: No chronic anticoag prior to admission  Goal(s) of therapy: Heparin level 0.3 - 0.7 units/mL Monitor platelets by anticoagulation protocol: Yes   Baseline anticoagulation labs: Recent Labs    02/14/24 1236 02/15/24 0230  APTT 29  --   INR 1.0  --   HGB 16.3 15.2  PLT 260 235    Date/Time Heparin Level Rate    06/05 @ 2026 0.37  1600 units/hr   06/06 @ 0230  0.28  1600 units/hr       PLAN: 6/06:  HL @ 0230 = 0.28, SUBtherapeutic - Will order heparin 1600 units IV X 1 bolus and increase drip rate to 1800 units/hr - will recheck HL 6 hrs after rate change  Continue to monitor HL and CBC daily while on heparin infusion.   Alvenia Job, PharmD Clinical Pharmacist 02/15/2024 3:16 AM

## 2024-02-15 NOTE — CV Procedure (Signed)
   TRANSESOPHAGEAL ECHOCARDIOGRAM GUIDED DIRECT CURRENT CARDIOVERSION  NAME:  Caleb Lopez   MRN: [id number] DOB:  05-13-1961   ADMIT DATE: 02/14/2024  INDICATIONS:  Atrial flutter  PROCEDURE:   Informed consent was obtained prior to the procedure. The risks, benefits and alternatives for the procedure were discussed and the patient comprehended these risks.  Risks include, but are not limited to, cough, sore throat, vomiting, nausea, somnolence, esophageal and stomach trauma or perforation, bleeding, low blood pressure, aspiration, pneumonia, infection, trauma to the teeth and death.    After a procedural time-out, the oropharynx was anesthetized and the patient was sedated by the anesthesia service. The transesophageal probe was inserted in the esophagus and stomach without difficulty and multiple views were obtained.   FINDINGS:  LEFT VENTRICLE: EF = 65-70% mild LVH  RIGHT VENTRICLE: Mild HK  LEFT ATRIUM: Moderately dilated  LEFT ATRIAL APPENDAGE: Small. No clot  RIGHT ATRIUM: Normal  AORTIC VALVE:  Trileaflet. No AS/AI  MITRAL VALVE:    Triv MR  TRICUSPID VALVE: Triv TR  PULMONIC VALVE: Triv PR  INTERATRIAL SEPTUM: No ASD or PFO  PERICARDIUM: Trivial effusion along RV  DESCENDING AORTA: Mild plaque   CARDIOVERSION:     Indications:  Atrial Flutter  Procedure Details:  Once the TEE was complete, the patient had the defibrillator pads placed in the anterior and posterior position. Once an appropriate level of sedation was achieved, the patient received a single biphasic, synchronized 200J shock with prompt conversion to sinus rhythm. No apparent complications.   Jules Oar, MD  1:11 PM

## 2024-02-17 NOTE — Discharge Summary (Signed)
 Physician Discharge Summary   Patient: Caleb Lopez MRN: [id number] DOB: May 01, 1961  Admit date:     02/14/2024  Discharge date: 02/15/2024  Discharge Physician: Brenna Cam   PCP: Carlean Charter, DO   Recommendations at discharge:    F/up with outpt providers as requested  Discharge Diagnoses: Principal Problem:   Atrial fibrillation with RVR (HCC) Active Problems:   Obstructive sleep apnea   Hyperlipidemia   Atrial flutter [facility])  Hospital Course: Assessment and Plan:  63 y.o. male with PMH significant for morbid obesity, untreated sleep apnea, prediabetes, HLD, nonobstructive CAD, BPH admitted for palpitation  A flutter with RVR Presented with 3 weeks of intermittent a flutter with symptoms of palpitation Heart rate in 150s.  No other symptoms currently Treated with Cardizem  drip and heparin  drip on admission S/p successful TEE-guided cardioversion.  TEE was notable for some mild right ventricular hypokinesis.  He denies chest pain, shortness of breath, and palpitations.  - he is doing well with restoration of sinus rhythm.  With his morbid obesity and history of sleep apnea, there is concern for high risk of recurrent atrial fibrillation/flutter.   - being D/Ced on oral amiodarone  load (400 mg p.o. twice daily x 7 days, then 200 mg p.o. twice daily x 7 days, then 200 mg daily thereafter).  - He is transitioned from heparin  to apixaban  5 mg p.o. twice daily.   - Outpt electrophysiology eval - Normal TSH & LFTs   Elevated troponin  h/o nonobstructive CAD Mildly elevated troponin.  Likely rate related.  Prediabetes A1c 6.2 in April 2025 Given his coexisting morbid obesity, patient will benefit from GLP-1 agonist as an outpatient   HLD Statin   Morbid Obesity  Body mass index is 40.54 kg/m. Patient has been advised to make an attempt to improve diet and exercise patterns to aid in weight loss.   OSA Previously diagnosed with sleep apnea.,  Could not tolerate  mask. Could benefit from GLP-1 agonist or an Inspire device.       Consultants: Cardio Procedures performed: TEE/Cardioversion  Disposition: Home Diet recommendation:  Discharge Diet Orders (From admission, onward)     Start     Ordered   02/15/24 0000  Diet - low sodium heart healthy        02/15/24 1608           Carb modified diet DISCHARGE MEDICATION: Allergies as of 02/15/2024   No Known Allergies      Medication List     STOP taking these medications    ketoconazole  2 % cream Commonly known as: NIZORAL    Na Sulfate-K Sulfate-Mg Sulfate concentrate 17.5-3.13-1.6 GM/177ML Soln Commonly known as: SUPREP   terbinafine  1 % cream Commonly known as: LAMISIL        TAKE these medications    amiodarone  200 MG tablet Commonly known as: PACERONE  Take 2 tablets (400 mg total) by mouth 2 (two) times daily for 7 days, THEN 1 tablet (200 mg total) 2 (two) times daily for 7 days, THEN 1 tablet (200 mg total) daily. Start taking on: February 15, 2024   atorvastatin  40 MG tablet Commonly known as: LIPITOR Take 1 tablet (40 mg total) by mouth daily.   Eliquis  5 MG Tabs tablet Generic drug: apixaban  Take 1 tablet (5 mg total) by mouth 2 (two) times daily.   Krill Oil 500 MG Caps Take 2 capsules by mouth daily.        Follow-up Information     Pardue,  Asencion Blacksmith, DO. Schedule an appointment as soon as possible for a visit in 1 week(s).   Specialty: Family Medicine Why: [facility] Discharge F/UP Contact information: [address] [place] [address] [phone]         Boyce Byes, MD. Schedule an appointment as soon as possible for a visit in 2 week(s).   Specialties: Cardiology, Radiology Why: [facility] Discharge F/UP Contact information: [address] [place] [address] [phone]                Discharge Exam: Cleavon Curls Weights   02/14/24 1223  Weight: 135.6 kg   General exam: Pleasant,  morbidly obese, middle-aged male Skin: No rashes, lesions or ulcers. HEENT: Atraumatic, normocephalic, no obvious bleeding Lungs: Clear to auscultation bilaterally,  CVS: S1, S2 normal, no murmur GI/Abd: Soft, nontender, distended from obesity, bowel sound present,   CNS: Alert, awake, oriented x 3 Psychiatry: Mood appropriate,  Extremities: No pedal edema, no calf tenderness,   Condition at discharge: good  The results of significant diagnostics from this hospitalization (including imaging, microbiology, ancillary and laboratory) are listed below for reference.   Imaging Studies: ECHO TEE Result Date: 02/15/2024    TRANSESOPHOGEAL ECHO REPORT   Patient Name:   Caleb Lopez Date of Exam: 02/15/2024 Medical Rec #:  [id number]       Height:       72.0 in Accession #:    [id number]      Weight:       298.9 lb Date of Birth:  [birth year]       BSA:          2.527 m Patient Age:    62 years        BP:           114/76 mmHg Patient Gender: M               HR:           78 bpm. Exam Location:  [facility] Procedure: Transesophageal Echo, Cardiac Doppler and Color Doppler (Both            Spectral and Color Flow Doppler were utilized during procedure). Indications:     Not listed on TEE check-in sheet  History:         Patient has no prior history of Echocardiogram examinations.                  CAD; Arrythmias:RBBB.  Sonographer:     Broadus Canes Referring Phys:  [id number] CHRISTOPHER RONALD BERGE Diagnosing Phys: Jules Oar MD PROCEDURE: The transesophogeal probe was passed without difficulty through the esophogus of the patient. Sedation performed by different physician. The patient's vital signs; including heart rate, blood pressure, and oxygen  saturation; remained stable throughout the procedure. The patient developed no complications during the procedure.  IMPRESSIONS  1. Left ventricular ejection fraction, by estimation, is 65 to 70%. The left ventricle has normal function. The left ventricle has no regional  wall motion abnormalities. There is mild concentric left ventricular hypertrophy.  2. Right ventricular systolic function is normal. The right ventricular size is normal.  3. Left atrial size was moderately dilated. No left atrial/left atrial appendage thrombus was detected.  4. The mitral valve is normal in structure. Trivial mitral valve regurgitation. No evidence of mitral stenosis.  5. The aortic valve is normal in structure. Aortic valve regurgitation is not visualized. No aortic stenosis is present.  6. There  is mild (Grade II) layered plaque involving the descending aorta.  7. The inferior vena cava is normal in size with greater than 50% respiratory variability, suggesting right atrial pressure of 3 mmHg. Conclusion(s)/Recommendation(s): Normal biventricular function without evidence of hemodynamically significant valvular heart disease. No LA/LAA thrombus identified. Successful cardioversion performed with restoration of normal sinus rhythm. FINDINGS  Left Ventricle: Left ventricular ejection fraction, by estimation, is 65 to 70%. The left ventricle has normal function. The left ventricle has no regional wall motion abnormalities. The left ventricular internal cavity size was normal in size. There is  mild concentric left ventricular hypertrophy. Right Ventricle: The right ventricular size is normal. No increase in right ventricular wall thickness. Right ventricular systolic function is normal. Left Atrium: Left atrial size was moderately dilated. No left atrial/left atrial appendage thrombus was detected. Right Atrium: Right atrial size was normal in size. Pericardium: Trivial pericardial effusion is present. The pericardial effusion is anterior to the right ventricle. Mitral Valve: The mitral valve is normal in structure. Trivial mitral valve regurgitation. No evidence of mitral valve stenosis. Tricuspid Valve: The tricuspid valve is normal in structure. Tricuspid valve regurgitation is trivial. No  evidence of tricuspid stenosis. Aortic Valve: The aortic valve is normal in structure. Aortic valve regurgitation is not visualized. No aortic stenosis is present. Pulmonic Valve: The pulmonic valve was normal in structure. Pulmonic valve regurgitation is trivial. No evidence of pulmonic stenosis. Aorta: The aortic root is normal in size and structure. There is mild (Grade II) layered plaque involving the descending aorta. Venous: The inferior vena cava is normal in size with greater than 50% respiratory variability, suggesting right atrial pressure of 3 mmHg. IAS/Shunts: No atrial level shunt detected by color flow Doppler. Jules Oar MD Electronically signed by Jules Oar MD Signature Date/Time: 02/15/2024/1:51:18 PM    Final    DG Chest Port 1 View Result Date: 02/14/2024 CLINICAL DATA:  Tachycardia. EXAM: PORTABLE CHEST 1 VIEW COMPARISON:  October 27, 2009. FINDINGS: Stable cardiomediastinal silhouette. Right lung is clear. Minimal left basilar subsegmental atelectasis or scarring is noted. Bony thorax is unremarkable. IMPRESSION: Minimal left basilar subsegmental atelectasis or scarring. Electronically Signed   By: Rosalene Colon M.D.   On: 02/14/2024 13:31    Microbiology: Results for orders placed or performed during the hospital encounter of 05/30/19  SARS CORONAVIRUS 2 (TAT 6-24 HRS) Nasopharyngeal Nasopharyngeal Swab     Status: None   Collection Time: 05/30/19 12:37 PM   Specimen: Nasopharyngeal Swab  Result Value Ref Range Status   SARS Coronavirus 2 NEGATIVE NEGATIVE Final    Comment: (NOTE) SARS-CoV-2 target nucleic acids are NOT DETECTED. The SARS-CoV-2 RNA is generally detectable in upper and lower respiratory specimens during the acute phase of infection. Negative results do not preclude SARS-CoV-2 infection, do not rule out co-infections with other pathogens, and should not be used as the sole basis for treatment or other patient management decisions. Negative results  must be combined with clinical observations, patient history, and epidemiological information. The expected result is Negative. Fact Sheet for Patients: HairSlick.no Fact Sheet for Healthcare Providers: quierodirigir.com This test is not yet approved or cleared by the [place]  FDA and  has been authorized for detection and/or diagnosis of SARS-CoV-2 by FDA under an Emergency Use Authorization (EUA). This EUA will remain  in effect (meaning this test can be used) for the duration of the COVID-19 declaration under Section 56 4(b)(1) of the Act, 21 U.S.C. section 360bbb-3(b)(1), unless the authorization is terminated  or revoked sooner. Performed at [facility], [address] [place], [place] [address]     Labs: CBC: Recent Labs  Lab 02/14/24 1236 02/15/24 0230  WBC 8.6 7.9  NEUTROABS 5.0  --   HGB 16.3 15.2  HCT 49.5 45.0  MCV 93.6 93.2  PLT 260 235   Basic Metabolic Panel: Recent Labs  Lab 02/14/24 1236 02/15/24 0230  NA 138 138  K 4.1 4.1  CL 109 109  CO2 22 23  GLUCOSE 124* 114*  BUN 17 16  CREATININE 1.00 0.80  CALCIUM  9.4 9.3   Liver Function Tests: Recent Labs  Lab 02/15/24 1615  AST 19  ALT 22  ALKPHOS 70  BILITOT 0.7  PROT 6.2*  ALBUMIN 3.5   CBG: No results for input(s): "GLUCAP" in the last 168 hours.  Discharge time spent: greater than 30 minutes.  Signed: Brenna Cam, MD [facility] 02/17/2024

## 2024-02-18 ENCOUNTER — Encounter: Payer: Self-pay | Admitting: Internal Medicine

## 2024-02-18 ENCOUNTER — Ambulatory Visit: Payer: Self-pay

## 2024-02-18 NOTE — Telephone Encounter (Signed)
 Pt not symptomatic, hospital follow up scheduled. Nothing further needed at this time.        Reason for Disposition  Nursing judgment  Protocols used: No Guideline or Reference Available-A-AH

## 2024-02-19 NOTE — Telephone Encounter (Signed)
FYI please see the message below.

## 2024-02-20 ENCOUNTER — Ambulatory Visit (INDEPENDENT_AMBULATORY_CARE_PROVIDER_SITE_OTHER): Admitting: Family Medicine

## 2024-02-20 ENCOUNTER — Encounter: Payer: Self-pay | Admitting: Family Medicine

## 2024-02-20 VITALS — BP 106/66 | HR 69 | Ht 72.0 in | Wt 302.1 lb

## 2024-02-20 DIAGNOSIS — Z09 Encounter for follow-up examination after completed treatment for conditions other than malignant neoplasm: Secondary | ICD-10-CM

## 2024-02-20 DIAGNOSIS — B356 Tinea cruris: Secondary | ICD-10-CM | POA: Diagnosis not present

## 2024-02-20 DIAGNOSIS — I484 Atypical atrial flutter: Secondary | ICD-10-CM

## 2024-02-20 DIAGNOSIS — G4733 Obstructive sleep apnea (adult) (pediatric): Secondary | ICD-10-CM | POA: Diagnosis not present

## 2024-02-20 MED ORDER — TIRZEPATIDE-WEIGHT MANAGEMENT 5 MG/0.5ML ~~LOC~~ SOAJ
5.0000 mg | SUBCUTANEOUS | 1 refills | Status: DC
Start: 2024-02-20 — End: 2024-06-03

## 2024-02-20 MED ORDER — TERBINAFINE HCL 1 % EX CREA: 1.0000 | TOPICAL_CREAM | Freq: Two times a day (BID) | CUTANEOUS | 2 refills | Status: DC

## 2024-02-20 MED ORDER — TIRZEPATIDE-WEIGHT MANAGEMENT 2.5 MG/0.5ML ~~LOC~~ SOAJ: 2.5000 mg | SUBCUTANEOUS | 2 refills | Status: DC

## 2024-02-20 NOTE — Progress Notes (Signed)
 Established patient visit   Patient: Caleb Lopez   DOB: [birth year]   63 y.o. Male  MRN: [id number] Visit Date: 02/20/2024  Today's healthcare provider: Carlean Charter, DO   Chief Complaint  Patient presents with   Follow-up    ER on 02/14/24   New Med Request    Would like a rx with Terbinafine  hydrocloride cream to help with jock itch   Subjective    HPI [name] is a 63 year old male with atrial fibrillation who presents for follow-up after cardioversion/hospitalization; discharged 02/15/2024.  He recently underwent cardioversion for atrial fibrillation. Prior to the procedure, a transesophageal echocardiogram was performed to check for blood clots. Since the procedure, he has experienced no chest pain, fluttering, or shortness of breath. His heart rate has been consistently in the seventies, as monitored by a home device. He is currently taking Eliquis  and amiodarone .  He has a history of sleep apnea and previously could not tolerate CPAP masks. He underwent a sleep study years ago but has not pursued treatment recently. He has made dietary changes, eliminating bread and pasta due to bowel movement issues, and has been eating beans, vegetables, and plant-based protein. He has not eaten meat for twenty years.  He experiences swelling in his left leg, particularly after prolonged sitting at work. The swelling improves with reduced sodium intake. He notes that bending over used to cause dizziness and seeing stars, but this has improved since the cardioversion.  He has a history of jock itch, which was previously treated with a cream that did not resolve the issue. He recalls using Lamisil  in the past, which was effective but unaffordable. He is considering using it again and is concerned about potential interactions with his current medications.  He has no personal or family history of pancreatitis or medullary thyroid cancer. His sister has a pituitary condition, and he  is unaware of any family history of multiple endocrine neoplasia type two.       Medications: Outpatient Medications Prior to Visit  Medication Sig   amiodarone  (PACERONE ) 200 MG tablet Take 2 tablets (400 mg total) by mouth 2 (two) times daily for 7 days, THEN 1 tablet (200 mg total) 2 (two) times daily for 7 days, THEN 1 tablet (200 mg total) daily.   apixaban  (ELIQUIS ) 5 MG TABS tablet Take 1 tablet (5 mg total) by mouth 2 (two) times daily.   atorvastatin  (LIPITOR) 40 MG tablet Take 1 tablet (40 mg total) by mouth daily.   Krill Oil 500 MG CAPS Take 2 capsules by mouth daily.   No facility-administered medications prior to visit.    Review of Systems  Respiratory:  Negative for shortness of breath.   Cardiovascular:  Negative for chest pain and palpitations.        Objective    BP 106/66 (BP Location: Left Arm, Patient Position: Sitting)   Pulse 69   Ht 6' (1.829 m)   Wt (!) 302 lb 1.6 oz (137 kg)   SpO2 98%   BMI 40.97 kg/m     Physical Exam Vitals and nursing note reviewed.  Constitutional:      General: He is not in acute distress.    Appearance: Normal appearance. He is not diaphoretic.  HENT:     Head: Normocephalic and atraumatic.  Eyes:     General: No scleral icterus.    Conjunctiva/sclera: Conjunctivae normal.  Cardiovascular:     Rate and Rhythm: Normal  rate and regular rhythm.     Pulses: Normal pulses.     Heart sounds: Normal heart sounds. No murmur heard. Pulmonary:     Effort: Pulmonary effort is normal. No respiratory distress.     Breath sounds: Normal breath sounds. No wheezing or rhonchi.  Abdominal:     General: Bowel sounds are normal.     Palpations: Abdomen is soft.  Musculoskeletal:     Cervical back: Neck supple.     Right lower leg: No edema.     Left lower leg: No edema.  Lymphadenopathy:     Cervical: No cervical adenopathy.  Skin:    General: Skin is warm and dry.     Findings: No rash.  Neurological:     Mental  Status: He is alert and oriented to person, place, and time. Mental status is at baseline.  Psychiatric:        Mood and Affect: Mood normal.        Behavior: Behavior normal.      No results found for any visits on 02/20/24.  Assessment & Plan    Atypical atrial flutter [facility])  Hospital discharge follow-up  Tinea cruris -     Terbinafine  HCl; Apply 1 Application topically 2 (two) times daily.  Dispense: 42 g; Refill: 2  Obstructive sleep apnea -     Tirzepatide-Weight Management; Inject 2.5 mg into the skin once a week.  Dispense: 2 mL; Refill: 2 -     Tirzepatide-Weight Management; Inject 5 mg into the skin once a week.  Dispense: 2 mL; Refill: 1  Morbid obesity (HCC)       Atypical Atrial Fibrillation; hospital discharge follow up  Recent cardioversion with no further symptoms. Heart rate controlled. On Eliquis  and amiodarone . Discussed potential undiagnosed episodes. Dizziness and shortness of breath improved. - Continue Eliquis  and amiodarone  as prescribed. - Follow up with EP cardiologist, Dr. Marven Slimmer, on March 05, 2024.  Obstructive Sleep Apnea CPAP intolerance. Discussed tirzepatide for weight loss and sleep apnea improvement. Explained mechanism and side effects. Willing to try medication. - Prescribe tirzepatide (Zepbound). - Instruct on self-administration of tirzepatide injection once weekly. - Discuss potential side effects: nausea, vomiting, diarrhea, constipation. - Advise to contact CPAP company for new options. - Consider Inspire device after weight loss if still preferred.  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 Lamisil  effective but unaffordable. No known interactions with current medications. - Prescribe Lamisil  cream. - Consult pharmacist regarding potential interactions.  General Health Maintenance Significant dietary changes for health improvement and symptom management. - Encourage continuation of current dietary  habits.     Follow-up Plans to monitor progress and adjust treatment. Aware of cardiologist follow-up and tirzepatide dosage adjustments. - Schedule follow-up in 3 months to assess tirzepatide response and recheck A1c. - Ensure EP cardiologist follow-up on March 05, 2024.  Return in about 3 months (around 05/12/2024) for DM, Weight, OSA.      I discussed the assessment and treatment plan with the patient  The patient was provided an opportunity to ask questions and all were answered. The patient agreed with the plan and demonstrated an understanding of the instructions.   The patient was advised to call back or seek an in-person evaluation if the symptoms worsen or if the condition fails to improve as anticipated.    Carlean Charter, DO  [facility] [phone] (phone) [phone] (fax)  [facility]

## 2024-02-21 ENCOUNTER — Telehealth: Payer: Self-pay | Admitting: Family Medicine

## 2024-02-21 NOTE — Telephone Encounter (Signed)
 Medication has been refilled during office visit 02/20/24

## 2024-02-21 NOTE — Telephone Encounter (Signed)
 [facility] pharmacy is requesting refill tirzepatide (ZEPBOUND) 2.5 MG/0.5ML Pen [ & tirzepatide (ZEPBOUND) 5 MG/0.5ML Pen [  Please advise

## 2024-02-25 ENCOUNTER — Telehealth: Payer: Self-pay | Admitting: Cardiology

## 2024-02-25 ENCOUNTER — Other Ambulatory Visit: Payer: Self-pay

## 2024-02-25 NOTE — Telephone Encounter (Signed)
 Spoke with patient regarding his HR and BP.  Pt is a new patient to cardiology and had a TEE recently with Dr. Bensimhon.  Pt was concerned about the fluctuation in HR and BP on 6/15.  HR 109 BP 119/60, HR 72, BP 100/54, HR 97 BP 100/54 and again this morning at 9am  HR 112  BP 84/43.  Pt states that he has not taken his morning meds, but wanted to call to make sure he did not need to go back to emergency room.  Pt states that BP is taken with a wrist cuff.  Pt denies symptoms of SOB, CP, DIZZINESS.  States that he feels fine.  Reassured pt that it was normal for HR and BP to fluctuate and as long as he was having no symptoms that a visit to the Emergency Room was not needed;  I did reiterate that should his heart rate stay consistent above 120 or below 50 with the above mentioned symptoms that he should seek medical attention by either calling 911 or going to ER. Pt has an appointment on 6/25 with Dr. Daneil Dunker for consult and asked if he needed to bring anything.  I explained that he just needed to bring in his current medications.  Pt verbalized understanding and thanked me for the call.

## 2024-02-25 NOTE — Telephone Encounter (Signed)
 STAT if HR is under 50 or over 120 (normal HR is 60-100 beats per minute)  What is your heart rate? 109, 72, 97  Do you have a log of your heart rate readings (document readings)?   Do you have any other symptoms? cough

## 2024-02-28 ENCOUNTER — Other Ambulatory Visit: Payer: Self-pay | Admitting: Family Medicine

## 2024-02-28 DIAGNOSIS — B356 Tinea cruris: Secondary | ICD-10-CM

## 2024-02-28 NOTE — Telephone Encounter (Signed)
 Copied from CRM [id number]. Topic: Clinical - Medication Refill >> Feb 28, 2024  8:07 AM Jalayah J wrote: Medication: terbinafine  (LAMISIL ) 1 % cream  Has the patient contacted their pharmacy? Yes (Agent: If no, request that the patient contact the pharmacy for the refill. If patient does not wish to contact the pharmacy document the reason why and proceed with request.) (Agent: If yes, when and what did the pharmacy advise?)  This is the patient's preferred pharmacy:  [id number] [place], [place] - [address] AT [facility] OF [address] [place] [address] Phone: [phone] Fax: [phone]  Is this the correct pharmacy for this prescription? Yes If no, delete pharmacy and type the correct one.   Has the prescription been filled recently? No  Is the patient out of the medication? Yes  Has the patient been seen for an appointment in the last year OR does the patient have an upcoming appointment? Yes  Can we respond through MyChart? Yes  Agent: Please be advised that Rx refills may take up to 3 business days. We ask that you follow-up with your pharmacy.

## 2024-02-29 NOTE — Telephone Encounter (Signed)
 Requested medications are due for refill today.  unsure  Requested medications are on the active medications list.  yes  Last refill. 02/20/2024   Future visit scheduled.   yes  Notes to clinic.  Medication not assigned to a protocol. Please review for refill.    Requested Prescriptions  Pending Prescriptions Disp Refills   terbinafine  (LAMISIL ) 1 % cream 42 g 2    Sig: Apply 1 Application topically 2 (two) times daily.     Off-Protocol Failed - 02/29/2024  4:47 PM      Failed - Medication not assigned to a protocol, review manually.      Failed - Valid encounter within last 12 months    Recent Outpatient Visits           1 week ago Atypical atrial flutter [facility])   [facility] [place], Asencion Blacksmith, DO   1 month ago Encounter for [employer] pneumococcal vaccination   [facility] P[place], Asencion Blacksmith, DO       Future Appointments             In 6 days Riddle, Suzann, NP [facility] [place]   In 3 months Pardue, Asencion Blacksmith, DO  [facility]   In 11 months Elta Halter, MD [facility]

## 2024-03-02 ENCOUNTER — Other Ambulatory Visit: Payer: Self-pay | Admitting: Family Medicine

## 2024-03-02 DIAGNOSIS — B356 Tinea cruris: Secondary | ICD-10-CM

## 2024-03-05 ENCOUNTER — Other Ambulatory Visit: Payer: Self-pay

## 2024-03-05 ENCOUNTER — Ambulatory Visit: Attending: Cardiology | Admitting: Cardiology

## 2024-03-05 VITALS — BP 101/75 | HR 131 | Ht 72.0 in | Wt 302.0 lb

## 2024-03-05 DIAGNOSIS — Z79899 Other long term (current) drug therapy: Secondary | ICD-10-CM

## 2024-03-05 DIAGNOSIS — I483 Typical atrial flutter: Secondary | ICD-10-CM | POA: Diagnosis not present

## 2024-03-05 NOTE — Patient Instructions (Addendum)
 Medication Instructions:  Your physician recommends that you continue on your current medications as directed. Please refer to the Current Medication list given to you today.  *If you need a refill on your cardiac medications before your next appointment, please call your pharmacy*  Lab Work: TODAY: BMET and CBC   Testing/Procedures: Ablation Your physician has recommended that you have an ablation. Catheter ablation is a medical procedure used to treat some cardiac arrhythmias (irregular heartbeats). During catheter ablation, a long, thin, flexible tube is put into a blood vessel in your groin (upper thigh), or neck. This tube is called an ablation catheter. It is then guided to your heart through the blood vessel. Radio frequency waves destroy small areas of heart tissue where abnormal heartbeats may cause an arrhythmia to start. Please see the instruction sheet given to you today.  Follow-Up: At [facility], you and your health needs are our priority.  As part of our continuing mission to provide you with exceptional heart care, we have created designated Provider Care Teams.  These Care Teams include your primary Cardiologist (physician) and Advanced Practice Providers (APPs -  Physician Assistants and Nurse Practitioners) who all work together to provide you with the care you need, when you need it.   Your next appointment:   We will contact you about your post-procedure follow up appointments.

## 2024-03-05 NOTE — H&P (View-Only) (Signed)
 Electrophysiology Office Note:    Date:  03/05/2024   ID:  Caleb Lopez, DOB 08/28/1961, MRN [id number]  [facility] Cardiologist:  Deatrice Cage, MD  [facility] Electrophysiologist:  OLE ONEIDA HOLTS, MD   Referring MD: Donzella Lauraine SAILOR, DO   Chief Complaint: Atrial flutter  History of Present Illness:    Mr. Caleb Lopez is a 63 year old man who I am seeing today for an evaluation of atrial flutter at the request of Dr. Donzella.  The patient was hospitalized February 14, 2024 with palpitations.  When he arrived to the ER he reported 2 weeks of intermittent palpitations.  He called his primary care physician and reported heart rates in the 150s and he was referred to the emergency department.  EKG performed in the emergency department showed 2-1 atrial flutter.  He was started on IV Cardizem  and heparin .  A TEE guided cardioversion was planned and performed June 6.  He converted to sinus rhythm with a 200 J synchronized shock.  He was discharged on amiodarone .  He is with family today in clinic.  He feels short of breath when in atrial flutter.      Their past medical, social and family history was reviewed.   ROS:   Please see the history of present illness.    All other systems reviewed and are negative.  EKGs/Labs/Other Studies Reviewed:    The following studies were reviewed today:  February 15, 2024 transesophageal echo EF 65-70 RV normal Dilated left atrium Trivial MR  February 14, 2024 EKG shows typical appearing atrial flutter, right bundle branch block, left anterior fascicular block  February 15, 2024 EKG shows sinus rhythm, right bundle branch block EKG Interpretation Date/Time:  Wednesday March 05 2024 15:01:30 EDT Ventricular Rate:  131 PR Interval:  142 QRS Duration:  142 QT Interval:  286 QTC Calculation: 422 R Axis:   -64  Text Interpretation: Atrial flutter Left axis deviation Right bundle branch block Confirmed by HOLTS OLE [id number]) on 03/05/2024 3:18:49 PM     Physical Exam:    VS:  BP 101/75   Pulse (!) 131   Ht 6' (1.829 m)   Wt (!) 302 lb (137 kg)   SpO2 95%   BMI 40.96 kg/m     Wt Readings from Last 3 Encounters:  03/05/24 (!) 302 lb (137 kg)  02/20/24 (!) 302 lb 1.6 oz (137 kg)  02/14/24 298 lb 15.1 oz (135.6 kg)     GEN: no distress CARD: RRR, No MRG RESP: No IWOB. CTAB.        ASSESSMENT AND PLAN:    1. Encounter for long-term (current) use of high-risk medication   2. Typical atrial flutter (HCC)     #Atrial flutter #High risk med monitoring-amiodarone  Patient with recent admission for symptomatic atrial flutter with rapidly conducted ventricular rates.  Normal left ventricular function.  I discussed treatment options with the patient including conservative management, antiarrhythmic drugs and catheter ablation.  No documented evidence of atrial fibrillation.  Discussed treatment options today for AFL including antiarrhythmic drug therapy and ablation. Discussed risks, recovery and likelihood of success with each treatment strategy. Risk, benefits, and alternatives to EP study and ablation for afib were discussed. These risks include but are not limited to stroke, bleeding, vascular damage, tamponade, perforation, damage to the esophagus, lungs, phrenic nerve and other structures, pulmonary vein stenosis, worsening renal function, coronary vasospasm and death.  Discussed potential need for repeat ablation procedures and antiarrhythmic drugs after an initial ablation.  The patient understands these risk and wishes to proceed.  We will therefore proceed with catheter ablation at the next available time.  Carto, ICE, anesthesia are requested for the procedure.    He will stop taking amiodarone  at the time of his ablation.  Continue Eliquis  for stroke prophylaxis.  I would like to get his ablation done in the next couple of weeks given his significantly elevated heart rates while in atrial  flutter.     Signed, Ole DASEN. Cindie, MD, [facility] 03/05/2024 3:29 PM    Electrophysiology  [facility]

## 2024-03-05 NOTE — Progress Notes (Signed)
 Electrophysiology Office Note:    Date:  03/05/2024   ID:  [name], DOB 08/28/1961, MRN [id number]  [facility] Cardiologist:  Deatrice Cage, MD  [facility] Electrophysiologist:  OLE ONEIDA HOLTS, MD   Referring MD: Donzella Lauraine SAILOR, DO   Chief Complaint: Atrial flutter  History of Present Illness:    Caleb Lopez is a 63 year old man who I am seeing today for an evaluation of atrial flutter at the request of Dr. Donzella.  The patient was hospitalized February 14, 2024 with palpitations.  When he arrived to the ER he reported 2 weeks of intermittent palpitations.  He called his primary care physician and reported heart rates in the 150s and he was referred to the emergency department.  EKG performed in the emergency department showed 2-1 atrial flutter.  He was started on IV Cardizem  and heparin .  A TEE guided cardioversion was planned and performed June 6.  He converted to sinus rhythm with a 200 J synchronized shock.  He was discharged on amiodarone .  He is with family today in clinic.  He feels short of breath when in atrial flutter.      Their past medical, social and family history was reviewed.   ROS:   Please see the history of present illness.    All other systems reviewed and are negative.  EKGs/Labs/Other Studies Reviewed:    The following studies were reviewed today:  February 15, 2024 transesophageal echo EF 65-70 RV normal Dilated left atrium Trivial MR  February 14, 2024 EKG shows typical appearing atrial flutter, right bundle branch block, left anterior fascicular block  February 15, 2024 EKG shows sinus rhythm, right bundle branch block EKG Interpretation Date/Time:  Wednesday March 05 2024 15:01:30 EDT Ventricular Rate:  131 PR Interval:  142 QRS Duration:  142 QT Interval:  286 QTC Calculation: 422 R Axis:   -64  Text Interpretation: Atrial flutter Left axis deviation Right bundle branch block Confirmed by HOLTS OLE [id number]) on 03/05/2024 3:18:49 PM     Physical Exam:    VS:  BP 101/75   Pulse (!) 131   Ht 6' (1.829 m)   Wt (!) 302 lb (137 kg)   SpO2 95%   BMI 40.96 kg/m     Wt Readings from Last 3 Encounters:  03/05/24 (!) 302 lb (137 kg)  02/20/24 (!) 302 lb 1.6 oz (137 kg)  02/14/24 298 lb 15.1 oz (135.6 kg)     GEN: no distress CARD: RRR, No MRG RESP: No IWOB. CTAB.        ASSESSMENT AND PLAN:    1. Encounter for long-term (current) use of high-risk medication   2. Typical atrial flutter (HCC)     #Atrial flutter #High risk med monitoring-amiodarone  Patient with recent admission for symptomatic atrial flutter with rapidly conducted ventricular rates.  Normal left ventricular function.  I discussed treatment options with the patient including conservative management, antiarrhythmic drugs and catheter ablation.  No documented evidence of atrial fibrillation.  Discussed treatment options today for AFL including antiarrhythmic drug therapy and ablation. Discussed risks, recovery and likelihood of success with each treatment strategy. Risk, benefits, and alternatives to EP study and ablation for afib were discussed. These risks include but are not limited to stroke, bleeding, vascular damage, tamponade, perforation, damage to the esophagus, lungs, phrenic nerve and other structures, pulmonary vein stenosis, worsening renal function, coronary vasospasm and death.  Discussed potential need for repeat ablation procedures and antiarrhythmic drugs after an initial ablation.  The patient understands these risk and wishes to proceed.  We will therefore proceed with catheter ablation at the next available time.  Carto, ICE, anesthesia are requested for the procedure.    He will stop taking amiodarone  at the time of his ablation.  Continue Eliquis  for stroke prophylaxis.  I would like to get his ablation done in the next couple of weeks given his significantly elevated heart rates while in atrial  flutter.     Signed, Ole DASEN. Cindie, MD, [facility] 03/05/2024 3:29 PM    Electrophysiology  [facility]

## 2024-03-06 ENCOUNTER — Telehealth: Payer: Self-pay | Admitting: Cardiology

## 2024-03-06 ENCOUNTER — Telehealth: Payer: Self-pay | Admitting: Emergency Medicine

## 2024-03-06 ENCOUNTER — Ambulatory Visit: Admitting: Cardiology

## 2024-03-06 LAB — BASIC METABOLIC PANEL WITH GFR
BUN/Creatinine Ratio: 19 (ref 10–24)
BUN: 20 mg/dL (ref 8–27)
CO2: 19 mmol/L — ABNORMAL LOW (ref 20–29)
Calcium: 10.2 mg/dL (ref 8.6–10.2)
Chloride: 105 mmol/L (ref 96–106)
Creatinine, Ser: 1.08 mg/dL (ref 0.76–1.27)
Glucose: 101 mg/dL — ABNORMAL HIGH (ref 70–99)
Potassium: 4.4 mmol/L (ref 3.5–5.2)
Sodium: 140 mmol/L (ref 134–144)
eGFR: 76 mL/min/{1.73_m2} (ref >59)

## 2024-03-06 LAB — CBC
Hematocrit: 46.8 % (ref 37.5–51.0)
Hemoglobin: 15.8 g/dL (ref 13.0–17.7)
MCH: 31.2 pg (ref 26.6–33.0)
MCHC: 33.8 g/dL (ref 31.5–35.7)
MCV: 92 fL (ref 79–97)
Platelets: 267 10*3/uL (ref 150–450)
RBC: 5.07 x10E6/uL (ref 4.14–5.80)
RDW: 13.2 % (ref 11.6–15.4)
WBC: 7.3 10*3/uL (ref 3.4–10.8)

## 2024-03-06 NOTE — Telephone Encounter (Signed)
 Spoke with pt, he does report that he is taking the eliquis  as directed.

## 2024-03-06 NOTE — Telephone Encounter (Signed)
 per Suzann Riddle: this patient is scheduled to see me Thursday but just saw Cindie on today (Wednesday). Does not need to see me at all, pls cancel the appt  I called the pt and per DPR message left for cancelation

## 2024-03-06 NOTE — Telephone Encounter (Signed)
 Pt c/o medication issue:  1. Name of Medication: apixaban  (ELIQUIS ) 5 MG TABS tablet   2. How are you currently taking this medication (dosage and times per day)? As prescribed   3. Are you having a reaction (difficulty breathing--STAT)? No   4. What is your medication issue? Patient states that he is taking medication twice a day as prescribed, unlike he advised Dr. Cindie at his appt in error.

## 2024-03-06 NOTE — Telephone Encounter (Signed)
 Wife returned call. I informed her that Caleb Lopez advised that he does not need to see her at all as he just saw Dr. Cindie on Wednesday. No further questions at this time.

## 2024-03-10 ENCOUNTER — Other Ambulatory Visit: Payer: Self-pay

## 2024-03-11 ENCOUNTER — Other Ambulatory Visit: Payer: Self-pay | Admitting: Cardiology

## 2024-03-11 ENCOUNTER — Telehealth: Payer: Self-pay

## 2024-03-11 DIAGNOSIS — G4733 Obstructive sleep apnea (adult) (pediatric): Secondary | ICD-10-CM

## 2024-03-11 MED ORDER — APIXABAN 5 MG PO TABS: 5.0000 mg | ORAL_TABLET | Freq: Two times a day (BID) | ORAL | 5 refills | Status: DC

## 2024-03-11 MED ORDER — AMIODARONE HCL 200 MG PO TABS: 200.0000 mg | ORAL_TABLET | Freq: Every day | ORAL | 3 refills | Status: DC

## 2024-03-11 NOTE — Telephone Encounter (Signed)
 Prescription refill request for Eliquis  received. Indication: A Flutter Last office visit: 03/05/24  Caleb Holts MD Scr: 1.08 on 03/05/24  Epic Age: 63 Weight:137kg  Based on above findings Eliquis  5mg  twice daily is the appropriate dose.  Refill approved.

## 2024-03-11 NOTE — Telephone Encounter (Signed)
 Copied from CRM [id number]. Topic: General - Other >> Mar 11, 2024 10:58 AM Cleave MATSU wrote: Reason for CRM: [facility] is calling because they stated the medication zepbound  needed Prior auth. Could you please follow up with them [phone]

## 2024-03-11 NOTE — Telephone Encounter (Signed)
*  STAT* If patient is at the pharmacy, call can be transferred to refill team.   1. Which medications need to be refilled? (please list name of each medication and dose if known)  apixaban  (ELIQUIS ) 5 MG TABS tablet  amiodarone  (PACERONE ) 200 MG tablet    2. Would you like to learn more about the convenience, safety, & potential cost savings by using the [facility]?     3. Are you open to using the [facility] (Type [facility].  ).   4. Which pharmacy/location (including street and city if local pharmacy) is medication to be sent to?[id number] - [place], [place] - [address] AT [address]    5. Do they need a 30 day or 90 day supply? 30 day

## 2024-03-11 NOTE — Telephone Encounter (Signed)
 Pt's medication was sent to pt's pharmacy as requested. Confirmation received.

## 2024-03-12 ENCOUNTER — Telehealth: Payer: Self-pay

## 2024-03-12 ENCOUNTER — Other Ambulatory Visit (HOSPITAL_COMMUNITY): Payer: Self-pay

## 2024-03-12 NOTE — Telephone Encounter (Signed)
 Pharmacy Patient Advocate Encounter   Received notification from Pt Calls Messages that prior authorization for Zepbound  2.5mg /0.17ml Pen is required/requested.   Insurance verification completed.   The patient is insured through [employer] H[employer]IZONS SR/RX SEC [employer] SR .   Per test claim: PT MUST ENROLL IN WEIGHT WATCHERS THROUGH [employer], COMMUNICATED TO CLINIC IN ORIGINAL REQUEST

## 2024-03-12 NOTE — Telephone Encounter (Signed)
 Per test claim, pt must be enrolled in weight watchers through his [employer] employer. Please confirm that pt is currently enrolled before we proceed with PA, thank you.

## 2024-03-13 ENCOUNTER — Telehealth: Payer: Self-pay

## 2024-03-13 ENCOUNTER — Telehealth (HOSPITAL_COMMUNITY): Payer: Self-pay

## 2024-03-13 DIAGNOSIS — G4733 Obstructive sleep apnea (adult) (pediatric): Secondary | ICD-10-CM

## 2024-03-13 NOTE — Telephone Encounter (Signed)
 Copied from CRM [id number]. Topic: Referral - Prior Authorization Question >> Mar 13, 2024  8:44 AM Charlet HERO wrote: Reason for CRM: patient is wanting to try the cpap again and he would like to have it prescribed so that he can have it for the 14th of July he is going to have ablasin on his heart and wants to have it when he goes to sleep on the first night after the ablasian. Patient is wanting to have a call back to discuss  further.

## 2024-03-13 NOTE — Telephone Encounter (Signed)
 Spoke with patient to discuss upcoming procedure.   Labs: completed.   Any recent signs of acute illness or been started on antibiotics? No Any new medications started? No Any medications to hold? Patient is currently not taking Zepbound  and aware not to start taking after July 6. Any missed doses of blood thinner? No Advised patient to continue taking ANTICOAGULANT: Eliquis  (Apixaban ) twice daily without missing any doses.  Medication instructions:  On the morning of your procedure DO NOT take any medication., including Eliquis  or the procedure may be rescheduled. Nothing to eat or drink after midnight prior to your procedure.  Confirmed patient is scheduled for Atrial Fibrillation Ablation on Monday, July 14 with Dr. Ole Holts. Instructed patient to arrive at the Main Entrance A at [facility]: [address] [place], [place] [address] and check in at Admitting at 10:30 AM.   Advised of plan to go home the same day and will only stay overnight if medically necessary. You MUST have a responsible adult to drive you home and MUST be with you the first 24 hours after you arrive home or your procedure could be cancelled.  Patient verbalized understanding to all instructions provided and agreed to proceed with procedure.

## 2024-03-17 ENCOUNTER — Telehealth: Payer: Self-pay | Admitting: Cardiology

## 2024-03-17 ENCOUNTER — Other Ambulatory Visit (HOSPITAL_COMMUNITY): Payer: Self-pay

## 2024-03-17 NOTE — Telephone Encounter (Signed)
 Patient would like to clarify medication instructions prior to 7/14 ablation with Dr. Cindie.

## 2024-03-17 NOTE — Telephone Encounter (Signed)
 Patient states he takes his Eliquis  normally at 11AM and 11PM and inquired if it was too late at night or too close to midnight to take the second dose prior to procedure. Advised patient it is OK to take his night time dose of Eliquis  at 11PM. Reminder to hold all medications the morning of procedure. Patient voiced understanding.

## 2024-03-17 NOTE — Telephone Encounter (Signed)
 Unfortunately, without a sleep study confirming OSA diagnosis, a PA would likely be denied. I see that pt has been referred for a sleep study, please reach out when results have been received.

## 2024-03-18 ENCOUNTER — Encounter: Payer: Self-pay | Admitting: Sleep Medicine

## 2024-03-18 ENCOUNTER — Ambulatory Visit (INDEPENDENT_AMBULATORY_CARE_PROVIDER_SITE_OTHER): Admitting: Sleep Medicine

## 2024-03-18 VITALS — BP 130/80 | HR 64 | Temp 97.7°F | Ht 72.0 in | Wt 312.6 lb

## 2024-03-18 DIAGNOSIS — Z87891 Personal history of nicotine dependence: Secondary | ICD-10-CM | POA: Diagnosis not present

## 2024-03-18 DIAGNOSIS — G4733 Obstructive sleep apnea (adult) (pediatric): Secondary | ICD-10-CM

## 2024-03-18 DIAGNOSIS — I4891 Unspecified atrial fibrillation: Secondary | ICD-10-CM | POA: Diagnosis not present

## 2024-03-18 DIAGNOSIS — Z6841 Body Mass Index (BMI) 40.0 and over, adult: Secondary | ICD-10-CM

## 2024-03-18 NOTE — Progress Notes (Signed)
 Name:Caleb Lopez MRN: [id number] DOB: Oct 04, 1960   CHIEF COMPLAINT:  EXCESSIVE DAYTIME SLEEPINESS   HISTORY OF PRESENT ILLNESS:  Caleb Lopez is a 63 y.o. w/ a h/o atrial fibrillation, hyperlipidemia and morbid obesity who present for c/o loud snoring, witnessed apnea and excessive daytime sleepiness which has been present for several years. Reports nocturnal awakenings due to nocturia, however does not have difficulty falling back to sleep. Reports a 50 lb weight gain over the last few years. Denies morning headaches, RLS symptoms, dream enactment, cataplexy, hypnagogic or hypnapompic hallucinations. Reports a family history of sleep apnea. Denies drowsy driving. Denies alcohol, tobacco or illicit drug use.   Bedtime 3 am on work days and 5 am on off days Sleep onset 15 mins Rise time variable    EPWORTH SLEEP SCORE 11    03/18/2024    3:00 PM  Results of the Epworth flowsheet  Sitting and reading 3  Watching TV 3  Sitting, inactive in a public place (e.g. a theatre or a meeting) 2  As a passenger in a car for an hour without a break 0  Lying down to rest in the afternoon when circumstances permit 0  Sitting and talking to someone 0  Sitting quietly after a lunch without alcohol 3  In a car, while stopped for a few minutes in traffic 0  Total score 11     PAST MEDICAL HISTORY :   has a past medical history of Borderline diabetes, CAD (coronary artery disease), Morbid obesity (HCC), Pericarditis (07/2008), and RBBB.  has a past surgical history that includes Tonsillectomy and adenoidectomy (1960'S); Thoracentesis (1980'S); Colonoscopy with propofol  (N/A, 06/03/2019); TEE without cardioversion (N/A, 02/15/2024); and Cardioversion (N/A, 02/15/2024). Prior to Admission medications   Medication Sig Start Date End Date Taking? Authorizing Provider  amiodarone  (PACERONE ) 200 MG tablet Take 1 tablet (200 mg total) by mouth daily. 03/11/24  Yes Cindie Ole DASEN, MD  apixaban   (ELIQUIS ) 5 MG TABS tablet Take 1 tablet (5 mg total) by mouth 2 (two) times daily. 03/11/24 04/10/24 Yes Darron Deatrice LABOR, MD  atorvastatin  (LIPITOR) 40 MG tablet Take 1 tablet (40 mg total) by mouth daily. 01/02/24  Yes Pardue, Lauraine SAILOR, DO  Krill Oil 500 MG CAPS Take 2 capsules by mouth daily.   Yes [provider]  terbinafine  (LAMISIL ) 1 % cream Apply 1 Application topically 2 (two) times daily. 02/20/24  Yes Pardue, Lauraine SAILOR, DO  tirzepatide  (ZEPBOUND ) 2.5 MG/0.5ML Pen Inject 2.5 mg into the skin once a week. 02/20/24  Yes Pardue, Lauraine SAILOR, DO  tirzepatide  (ZEPBOUND ) 5 MG/0.5ML Pen Inject 5 mg into the skin once a week. 02/20/24  Yes Pardue, Lauraine SAILOR, DO   No Known Allergies  FAMILY HISTORY:  family history includes Arthritis in his mother; Diabetes in his father; Diverticulitis in his mother; Glaucoma in his mother. SOCIAL HISTORY:  reports that he quit smoking about 35 years ago. His smoking use included cigarettes. He has never used smokeless tobacco. He reports current alcohol use. He reports that he does not use drugs.   Review of Systems:  Gen:  Denies  fever, sweats, chills weight loss  HEENT: Denies blurred vision, double vision, ear pain, eye pain, hearing loss, nose bleeds, sore throat Cardiac:  No dizziness, chest pain or heaviness, chest tightness,edema, No JVD Resp:   No cough, -sputum production, -shortness of breath,-wheezing, -hemoptysis,  Gi: Denies swallowing difficulty, stomach pain, nausea or vomiting, diarrhea, constipation, bowel incontinence  Gu:  Denies bladder incontinence, burning urine Ext:   Denies Joint pain, stiffness or swelling Skin: Denies  skin rash, easy bruising or bleeding or hives Endoc:  Denies polyuria, polydipsia , polyphagia or weight change Psych:   Denies depression, insomnia or hallucinations  Other:  All other systems negative  VITAL SIGNS: Wt (!) 312 lb 9.6 oz (141.8 kg)   BMI 42.40 kg/m    Physical Examination:   General  Appearance: No distress  EYES PERRLA, EOM intact.   NECK Supple, No JVD Pulmonary: normal breath sounds, No wheezing.  CardiovascularNormal S1,S2.  No m/r/g.   Abdomen: Benign, Soft, non-tender. Skin:   warm, no rashes, no ecchymosis  Extremities: normal, no cyanosis, clubbing. Neuro:without focal findings,  speech normal  PSYCHIATRIC: Mood, affect within normal limits.   ASSESSMENT AND PLAN  OSA I suspect that OSA is likely present due to clinical presentation. Discussed the consequences of untreated sleep apnea. Advised not to drive drowsy for safety of patient and others. Will complete further evaluation with a home sleep study and follow up to review results.    Atrial fibrillation Stable, on current management. Following with PCP.   Morbid obesity Counseled patient on diet and lifestyle modification.    MEDICATION ADJUSTMENTS/LABS AND TESTS ORDERED: Recommend Sleep Study   Patient  satisfied with Plan of action and management. All questions answered  Follow up to review HST results and treatment plan.   I spent a total of 31 minutes reviewing chart data, face-to-face evaluation with the patient, counseling and coordination of care as detailed above.    Destine Zirkle, M.D.  Sleep Medicine [facility]

## 2024-03-18 NOTE — Patient Instructions (Signed)
 Caleb Lopez

## 2024-03-21 NOTE — Pre-Procedure Instructions (Signed)
 Attempted to call patient regarding procedure instructions.  Left voicemail on the following items: Arrival time 1000 Nothing to eat or drink after midnight No meds AM of procedure Responsible person to drive you home and stay with you for 24 hrs  Have you missed any doses of anti-coagulant Eliquis- should be taken twice a day, if you have missed any doses please let us know.  Don't take morning of procedure.

## 2024-03-23 NOTE — Anesthesia Preprocedure Evaluation (Signed)
 Anesthesia Evaluation  Patient identified by MRN, date of birth, ID band Patient awake    Reviewed: Allergy & Precautions, NPO status , Patient's Chart, lab work & pertinent test results  History of Anesthesia Complications Negative for: history of anesthetic complications  Airway Mallampati: II  TM Distance: >3 FB Neck ROM: Full    Dental no notable dental hx.    Pulmonary sleep apnea , former smoker   Pulmonary exam normal        Cardiovascular + CAD  Normal cardiovascular exam+ dysrhythmias Atrial Fibrillation   Normal EF   Neuro/Psych    GI/Hepatic negative GI ROS, Neg liver ROS,,,  Endo/Other    Class 3 obesity (BMI 42)  Renal/GU negative Renal ROS     Musculoskeletal negative musculoskeletal ROS (+)    Abdominal   Peds  Hematology negative hematology ROS (+)   Anesthesia Other Findings Day of surgery medications reviewed with patient.  Reproductive/Obstetrics                              Anesthesia Physical Anesthesia Plan  ASA: 3  Anesthesia Plan: General   Post-op Pain Management: Minimal or no pain anticipated   Induction: Intravenous  PONV Risk Score and Plan: 2 and Treatment may vary due to age or medical condition, Ondansetron , Dexamethasone  and Midazolam   Airway Management Planned: Oral ETT  Additional Equipment: None  Intra-op Plan:   Post-operative Plan: Extubation in OR  Informed Consent: I have reviewed the patients History and Physical, chart, labs and discussed the procedure including the risks, benefits and alternatives for the proposed anesthesia with the patient or authorized representative who has indicated his/her understanding and acceptance.     Dental advisory given  Plan Discussed with: CRNA  Anesthesia Plan Comments:          Anesthesia Quick Evaluation

## 2024-03-24 ENCOUNTER — Ambulatory Visit (HOSPITAL_COMMUNITY)
Admission: RE | Disposition: A | Discharge status: Home / Self Care | Payer: Self-pay | Source: Home / Self Care | Attending: Cardiology

## 2024-03-24 ENCOUNTER — Other Ambulatory Visit: Payer: Self-pay

## 2024-03-24 ENCOUNTER — Encounter (HOSPITAL_COMMUNITY): Payer: Self-pay | Admitting: Cardiology

## 2024-03-24 ENCOUNTER — Ambulatory Visit (HOSPITAL_BASED_OUTPATIENT_CLINIC_OR_DEPARTMENT_OTHER)

## 2024-03-24 ENCOUNTER — Ambulatory Visit (HOSPITAL_COMMUNITY): Payer: Self-pay | Admitting: Anesthesiology

## 2024-03-24 ENCOUNTER — Ambulatory Visit (HOSPITAL_BASED_OUTPATIENT_CLINIC_OR_DEPARTMENT_OTHER): Payer: Self-pay | Admitting: Anesthesiology

## 2024-03-24 ENCOUNTER — Ambulatory Visit (HOSPITAL_COMMUNITY)
Admission: RE | Admit: 2024-03-24 | Discharge: 2024-03-24 | Disposition: A | Discharge status: Home / Self Care | Attending: Cardiology | Admitting: Cardiology

## 2024-03-24 DIAGNOSIS — Z87891 Personal history of nicotine dependence: Secondary | ICD-10-CM

## 2024-03-24 DIAGNOSIS — Z7901 Long term (current) use of anticoagulants: Secondary | ICD-10-CM | POA: Insufficient documentation

## 2024-03-24 DIAGNOSIS — Z79899 Other long term (current) drug therapy: Secondary | ICD-10-CM | POA: Diagnosis not present

## 2024-03-24 DIAGNOSIS — I2510 Atherosclerotic heart disease of native coronary artery without angina pectoris: Secondary | ICD-10-CM | POA: Diagnosis not present

## 2024-03-24 DIAGNOSIS — I483 Typical atrial flutter: Secondary | ICD-10-CM | POA: Insufficient documentation

## 2024-03-24 DIAGNOSIS — I4891 Unspecified atrial fibrillation: Secondary | ICD-10-CM

## 2024-03-24 DIAGNOSIS — I4892 Unspecified atrial flutter: Secondary | ICD-10-CM

## 2024-03-24 DIAGNOSIS — I517 Cardiomegaly: Secondary | ICD-10-CM | POA: Diagnosis not present

## 2024-03-24 DIAGNOSIS — G4733 Obstructive sleep apnea (adult) (pediatric): Secondary | ICD-10-CM | POA: Diagnosis not present

## 2024-03-24 DIAGNOSIS — G4730 Sleep apnea, unspecified: Secondary | ICD-10-CM | POA: Insufficient documentation

## 2024-03-24 DIAGNOSIS — E66813 Obesity, class 3: Secondary | ICD-10-CM | POA: Diagnosis not present

## 2024-03-24 DIAGNOSIS — I3139 Other pericardial effusion (noninflammatory): Secondary | ICD-10-CM | POA: Diagnosis not present

## 2024-03-24 DIAGNOSIS — Z6841 Body Mass Index (BMI) 40.0 and over, adult: Secondary | ICD-10-CM | POA: Diagnosis not present

## 2024-03-24 HISTORY — PX: A-FLUTTER ABLATION: EP1230

## 2024-03-24 LAB — ECHOCARDIOGRAM COMPLETE
Height: 72 in
S' Lateral: 3.1 cm
Weight: 4992 [oz_av]

## 2024-03-24 SURGERY — A-FLUTTER ABLATION
Anesthesia: General

## 2024-03-24 MED ORDER — SODIUM CHLORIDE 0.9 % IV SOLN: INTRAVENOUS | Status: DC

## 2024-03-24 MED ORDER — ACETAMINOPHEN 325 MG PO TABS: 650.0000 mg | ORAL_TABLET | ORAL | Status: DC | PRN

## 2024-03-24 MED ORDER — PHENYLEPHRINE HCL-NACL 20-0.9 MG/250ML-% IV SOLN
INTRAVENOUS | Status: DC | PRN
  Administered 2024-03-24: 20 ug/min via INTRAVENOUS

## 2024-03-24 MED ORDER — ROCURONIUM BROMIDE 10 MG/ML (PF) SYRINGE
PREFILLED_SYRINGE | INTRAVENOUS | Status: DC | PRN
  Administered 2024-03-24: 80 mg via INTRAVENOUS

## 2024-03-24 MED ORDER — PROPOFOL 10 MG/ML IV BOLUS
INTRAVENOUS | Status: DC | PRN
  Administered 2024-03-24: 150 mg via INTRAVENOUS

## 2024-03-24 MED ORDER — ONDANSETRON HCL 4 MG/2ML IJ SOLN
INTRAMUSCULAR | Status: DC | PRN
  Administered 2024-03-24: 4 mg via INTRAVENOUS

## 2024-03-24 MED ORDER — SUGAMMADEX SODIUM 200 MG/2ML IV SOLN
INTRAVENOUS | Status: DC | PRN
  Administered 2024-03-24: 566 mg via INTRAVENOUS

## 2024-03-24 MED ORDER — LIDOCAINE 2% (20 MG/ML) 5 ML SYRINGE
INTRAMUSCULAR | Status: DC | PRN
  Administered 2024-03-24: 80 mg via INTRAVENOUS

## 2024-03-24 MED ORDER — HEPARIN SODIUM (PORCINE) 1000 UNIT/ML IJ SOLN
INTRAMUSCULAR | Status: AC
  Filled 2024-03-24: qty 10

## 2024-03-24 MED ORDER — SODIUM CHLORIDE 0.9 % IV SOLN: 250.0000 mL | INTRAVENOUS | Status: DC | PRN

## 2024-03-24 MED ORDER — FENTANYL CITRATE (PF) 250 MCG/5ML IJ SOLN
INTRAMUSCULAR | Status: DC | PRN
  Administered 2024-03-24: 100 ug via INTRAVENOUS

## 2024-03-24 MED ORDER — DEXAMETHASONE SODIUM PHOSPHATE 10 MG/ML IJ SOLN
INTRAMUSCULAR | Status: DC | PRN
  Administered 2024-03-24: 10 mg via INTRAVENOUS

## 2024-03-24 MED ORDER — PHENYLEPHRINE 80 MCG/ML (10ML) SYRINGE FOR IV PUSH (FOR BLOOD PRESSURE SUPPORT)
PREFILLED_SYRINGE | INTRAVENOUS | Status: DC | PRN
  Administered 2024-03-24 (×5): 160 ug via INTRAVENOUS

## 2024-03-24 MED ORDER — FENTANYL CITRATE (PF) 100 MCG/2ML IJ SOLN
INTRAMUSCULAR | Status: AC
  Filled 2024-03-24: qty 2

## 2024-03-24 MED ORDER — ONDANSETRON HCL 4 MG/2ML IJ SOLN: 4.0000 mg | Freq: Four times a day (QID) | INTRAMUSCULAR | Status: DC | PRN

## 2024-03-24 MED ORDER — MIDAZOLAM HCL 2 MG/2ML IJ SOLN
INTRAMUSCULAR | Status: AC
  Filled 2024-03-24: qty 2

## 2024-03-24 MED ORDER — SODIUM CHLORIDE 0.9% FLUSH: 3.0000 mL | Freq: Two times a day (BID) | INTRAVENOUS | Status: DC

## 2024-03-24 MED ORDER — MIDAZOLAM HCL 2 MG/2ML IJ SOLN
INTRAMUSCULAR | Status: DC | PRN
  Administered 2024-03-24: 2 mg via INTRAVENOUS

## 2024-03-24 MED ORDER — APIXABAN 5 MG PO TABS
5.0000 mg | ORAL_TABLET | Freq: Two times a day (BID) | ORAL | Status: DC
  Administered 2024-03-24: 5 mg via ORAL
  Filled 2024-03-24: qty 1

## 2024-03-24 MED ORDER — SODIUM CHLORIDE 0.9% FLUSH: 3.0000 mL | INTRAVENOUS | Status: DC | PRN

## 2024-03-24 MED ORDER — HEPARIN (PORCINE) IN NACL 1000-0.9 UT/500ML-% IV SOLN
INTRAVENOUS | Status: DC | PRN
  Administered 2024-03-24: 500 mL

## 2024-03-24 MED ORDER — HEPARIN SODIUM (PORCINE) 1000 UNIT/ML IJ SOLN
INTRAMUSCULAR | Status: DC | PRN
  Administered 2024-03-24: 1000 [IU] via INTRAVENOUS

## 2024-03-24 SURGICAL SUPPLY — 13 items
CATH ABLAT QDOT MICRO BI TC FJ (CATHETERS) IMPLANT
CATH GE 8FR SOUNDSTAR (CATHETERS) IMPLANT
CATH WEBSTER BI DIR CS D-F CRV (CATHETERS) IMPLANT
CLOSURE PERCLOSE PROSTYLE (VASCULAR PRODUCTS) IMPLANT
MAT PREVALON FULL STRYKER (MISCELLANEOUS) IMPLANT
PACK EP LF (CUSTOM PROCEDURE TRAY) ×1 IMPLANT
PAD DEFIB RADIO PHYSIO CONN (PAD) ×1 IMPLANT
PATCH CARTO3 (PAD) IMPLANT
SHEATH CARTO VIZIGO MED CURVE (SHEATH) IMPLANT
SHEATH PINNACLE 8F 10CM (SHEATH) IMPLANT
SHEATH PINNACLE 9F 10CM (SHEATH) IMPLANT
SHEATH PROBE COVER 6X72 (BAG) IMPLANT
TUBING SMART ABLATE COOLFLOW (TUBING) IMPLANT

## 2024-03-24 NOTE — Discharge Instructions (Signed)

## 2024-03-24 NOTE — Transfer of Care (Signed)
 Immediate Anesthesia Transfer of Care Note  Patient: Caleb Lopez  Procedure(s) Performed: A-FLUTTER ABLATION  Patient Location: Cath Lab  Anesthesia Type:General  Level of Consciousness: awake, alert , and oriented  Airway & Oxygen  Therapy: Patient Spontanous Breathing and Patient connected to nasal cannula oxygen   Post-op Assessment: Report given to RN, Post -op Vital signs reviewed and stable, Patient moving all extremities X 4, and Patient able to stick tongue midline  Post vital signs: Reviewed and stable  Last Vitals:  Vitals Value Taken Time  BP 101/74 03/24/24 15:11  Temp 36.9 C 03/24/24 15:13  Pulse 70 03/24/24 15:14  Resp 16 03/24/24 15:14  SpO2 98 % 03/24/24 15:14  Vitals shown include unfiled device data.  Last Pain:  Vitals:   03/24/24 1513  TempSrc: Axillary  PainSc: 0-No pain         Complications: No notable events documented.

## 2024-03-24 NOTE — Anesthesia Postprocedure Evaluation (Signed)
 Anesthesia Post Note  Patient: Caleb Lopez  Procedure(s) Performed: A-FLUTTER ABLATION     Patient location during evaluation: PACU Anesthesia Type: General Level of consciousness: awake and alert and oriented Pain management: pain level controlled Vital Signs Assessment: post-procedure vital signs reviewed and stable Respiratory status: spontaneous breathing, nonlabored ventilation and respiratory function stable Cardiovascular status: blood pressure returned to baseline and stable Postop Assessment: no apparent nausea or vomiting Anesthetic complications: no   No notable events documented.  Last Vitals:  Vitals:   03/24/24 1545 03/24/24 1600  BP: (!) 87/63 90/65  Pulse: 73 72  Resp: 14 15  Temp:    SpO2: 96% 96%    Last Pain:  Vitals:   03/24/24 1600  TempSrc:   PainSc: 0-No pain   Pain Goal:                   Zula Hovsepian A.

## 2024-03-24 NOTE — Anesthesia Procedure Notes (Addendum)
 Procedure Name: Intubation Date/Time: 03/24/2024 1:45 PM  Performed by: Elby Raelene SAUNDERS, CRNAPre-anesthesia Checklist: Patient identified, Emergency Drugs available, Suction available and Patient being monitored Patient Re-evaluated:Patient Re-evaluated prior to induction Oxygen  Delivery Method: Circle System Utilized Preoxygenation: Pre-oxygenation with 100% oxygen  Induction Type: IV induction and Cricoid Pressure applied Ventilation: Oral airway inserted - appropriate to patient size and Two handed mask ventilation required Laryngoscope Size: Mac and 4 Grade View: Grade III Tube type: Oral Tube size: 7.5 mm Number of attempts: 1 Airway Equipment and Method: Stylet, Oral airway and Bite block Placement Confirmation: ETT inserted through vocal cords under direct vision, positive ETCO2 and breath sounds checked- equal and bilateral Secured at: 23 cm Tube secured with: Tape Dental Injury: Teeth and Oropharynx as per pre-operative assessment

## 2024-03-24 NOTE — Progress Notes (Signed)
 Echocardiogram 2D Echocardiogram has been performed.  Caleb Lopez Peg Fifer RDCS 03/24/2024, 12:10 PM

## 2024-03-24 NOTE — Interval H&P Note (Signed)
 History and Physical Interval Note:  03/24/2024 12:10 PM  Caleb Lopez  has presented today for surgery, with the diagnosis of aflutter.  The various methods of treatment have been discussed with the patient and family. After consideration of risks, benefits and other options for treatment, the patient has consented to  Procedure(s): A-FLUTTER ABLATION (N/A) as a surgical intervention.  The patient's history has been reviewed, patient examined, no change in status, stable for surgery.  I have reviewed the patient's chart and labs.  Questions were answered to the patient's satisfaction.     Caleb Lopez

## 2024-03-25 ENCOUNTER — Telehealth (HOSPITAL_COMMUNITY): Payer: Self-pay

## 2024-03-25 ENCOUNTER — Encounter (HOSPITAL_COMMUNITY): Payer: Self-pay | Admitting: Cardiology

## 2024-03-25 NOTE — Telephone Encounter (Signed)
 Spoke with patient to complete post procedure follow up call.  Patient reports no complications with groin sites.   Instructions reviewed with patient:  Remove large bandage at puncture site after 24 hours. It is normal to have bruising, tenderness, mild swelling, and a pea or marble sized lump/knot at the groin site which can take up to three months to resolve.  Get help right away if you notice sudden swelling at the puncture site.  Check your puncture site every day for signs of infection: fever, redness, swelling, pus drainage, warmth, foul odor or excessive pain. If this occurs, please call the office at [phone], to speak with the nurse. Get help right away if your puncture site is bleeding and the bleeding does not stop after applying firm pressure to the area.  You may continue to have skipped beats/ atrial flutter during the first several months after your procedure.  It is very important not to miss any doses of your blood thinner Eliquis .    You will follow up with the APP on 05/05/24.   Patient verbalized understanding to all instructions provided.

## 2024-03-26 MED FILL — Fentanyl Citrate Preservative Free (PF) Inj 100 MCG/2ML: INTRAMUSCULAR | Qty: 2 | Status: AC

## 2024-03-27 ENCOUNTER — Encounter

## 2024-03-27 DIAGNOSIS — G4733 Obstructive sleep apnea (adult) (pediatric): Secondary | ICD-10-CM

## 2024-04-02 ENCOUNTER — Ambulatory Visit: Admitting: Dermatology

## 2024-04-07 DIAGNOSIS — R069 Unspecified abnormalities of breathing: Secondary | ICD-10-CM | POA: Diagnosis not present

## 2024-04-09 ENCOUNTER — Ambulatory Visit: Payer: Self-pay

## 2024-04-09 ENCOUNTER — Ambulatory Visit: Admitting: Dermatology

## 2024-04-09 DIAGNOSIS — G4733 Obstructive sleep apnea (adult) (pediatric): Secondary | ICD-10-CM

## 2024-04-14 ENCOUNTER — Ambulatory Visit (INDEPENDENT_AMBULATORY_CARE_PROVIDER_SITE_OTHER): Admitting: Sleep Medicine

## 2024-04-14 VITALS — BP 120/78 | HR 68 | Temp 98.6°F | Ht 72.0 in | Wt 314.4 lb

## 2024-04-14 DIAGNOSIS — G4733 Obstructive sleep apnea (adult) (pediatric): Secondary | ICD-10-CM | POA: Diagnosis not present

## 2024-04-14 DIAGNOSIS — Z87891 Personal history of nicotine dependence: Secondary | ICD-10-CM

## 2024-04-14 DIAGNOSIS — I4891 Unspecified atrial fibrillation: Secondary | ICD-10-CM

## 2024-04-14 DIAGNOSIS — Z6841 Body Mass Index (BMI) 40.0 and over, adult: Secondary | ICD-10-CM

## 2024-04-14 NOTE — Patient Instructions (Signed)
 Patient Instructions  Continue to use CPAP every night, minimum of 7-8 hours a night.  Change equipment every 30 days or as directed by DME.  Wash your tubing with warm soap and weekly, hang to dry. Wash humidifier portion weekly. Use distilled water and change daily   Be aware of reduced alertness and do not drive or operate heavy machinery if experiencing this or drowsiness.  Exercise encouraged, as tolerated. Encouraged proper weight management.  Important to get eight or more hours of sleep  Limiting the use of the computer and television before bedtime.  Decrease naps during the day, so night time sleep will become enhanced.  Limit caffeine, and sleep deprivation.  HTN, stroke, uncontrolled diabetes and heart failure are potential risk factors.  Risk of untreated sleep apnea including cardiac arrhthymias, stroke, DM, pulm HTN.

## 2024-04-14 NOTE — Progress Notes (Unsigned)
 Name:Caleb Lopez MRN: [id number] DOB: [birth year]   CHIEF COMPLAINT:  HST F/U   HISTORY OF PRESENT ILLNESS:  Caleb Lopez is a 64 y.o. w/ a h/o atrial fibrillation, hyperlipidemia and morbid obesity who presents to F/U on HST results. The patient underwent HST which revealed severe OSA (AHI 67, O2 nadir 83%).    EPWORTH SLEEP SCORE    03/18/2024    3:00 PM  Results of the Epworth flowsheet  Sitting and reading 3  Watching TV 3  Sitting, inactive in a public place (e.g. a theatre or a meeting) 2  As a passenger in a car for an hour without a break 0  Lying down to rest in the afternoon when circumstances permit 0  Sitting and talking to someone 0  Sitting quietly after a lunch without alcohol 3  In a car, while stopped for a few minutes in traffic 0  Total score 11     PAST MEDICAL HISTORY :   has a past medical history of Borderline diabetes, CAD (coronary artery disease), Morbid obesity (HCC), Pericarditis (07/2008), and RBBB.  has a past surgical history that includes Tonsillectomy and adenoidectomy (1960'S); Thoracentesis (1980'S); Colonoscopy with propofol  (N/A, 06/03/2019); TEE without cardioversion (N/A, 02/15/2024); Cardioversion (N/A, 02/15/2024); and A-FLUTTER ABLATION (N/A, 03/24/2024). Prior to Admission medications   Medication Sig Start Date End Date Taking? Authorizing Provider  apixaban  (ELIQUIS ) 5 MG TABS tablet Take 1 tablet (5 mg total) by mouth 2 (two) times daily. 03/11/24 04/14/24 Yes Darron Deatrice LABOR, MD  atorvastatin  (LIPITOR) 40 MG tablet Take 1 tablet (40 mg total) by mouth daily. 01/02/24  Yes Pardue, Lauraine SAILOR, DO  Krill Oil 500 MG CAPS Take 1,000 mg by mouth daily at 12 noon. With Atorvastatin    Yes [provider]  terbinafine  (LAMISIL ) 1 % cream Apply 1 Application topically 2 (two) times daily. 02/20/24  Yes Pardue, Lauraine SAILOR, DO  tirzepatide  (ZEPBOUND ) 2.5 MG/0.5ML Pen Inject 2.5 mg into the skin once a week. Patient not taking: Reported on  04/14/2024 02/20/24   Donzella Lauraine SAILOR, DO  tirzepatide  (ZEPBOUND ) 5 MG/0.5ML Pen Inject 5 mg into the skin once a week. Patient not taking: Reported on 04/14/2024 02/20/24   Donzella Lauraine SAILOR, DO   No Known Allergies  FAMILY HISTORY:  family history includes Arthritis in his mother; Diabetes in his father; Diverticulitis in his mother; Glaucoma in his mother. SOCIAL HISTORY:  reports that he quit smoking about 35 years ago. His smoking use included cigarettes. He has never used smokeless tobacco. He reports current alcohol use. He reports that he does not use drugs.   Review of Systems:  Gen:  Denies  fever, sweats, chills weight loss  HEENT: Denies blurred vision, double vision, ear pain, eye pain, hearing loss, nose bleeds, sore throat Cardiac:  No dizziness, chest pain or heaviness, chest tightness,edema, No JVD Resp:   No cough, -sputum production, -shortness of breath,-wheezing, -hemoptysis,  Gi: Denies swallowing difficulty, stomach pain, nausea or vomiting, diarrhea, constipation, bowel incontinence Gu:  Denies bladder incontinence, burning urine Ext:   Denies Joint pain, stiffness or swelling Skin: Denies  skin rash, easy bruising or bleeding or hives Endoc:  Denies polyuria, polydipsia , polyphagia or weight change Psych:   Denies depression, insomnia or hallucinations  Other:  All other systems negative  VITAL SIGNS: BP 120/78 (BP Location: Right Arm, Patient Position: Sitting, Cuff Size: Large)   Pulse 68   Temp 98.6 F (37 C) (  Oral)   Ht 6' (1.829 m)   Wt (!) 314 lb 6.4 oz (142.6 kg)   SpO2 94%   BMI 42.64 kg/m    Physical Examination:   General Appearance: No distress  EYES PERRLA, EOM intact.   NECK Supple, No JVD Pulmonary: normal breath sounds, No wheezing.  CardiovascularNormal S1,S2.  No m/r/g.   Abdomen: Benign, Soft, non-tender. Skin:   warm, no rashes, no ecchymosis  Extremities: normal, no cyanosis, clubbing. Neuro:without focal findings,  speech normal   PSYCHIATRIC: Mood, affect within normal limits.   ASSESSMENT AND PLAN  OSA Reviewed HST results with patient. Starting on APAP therapy set to 4-16 cm H2O. Discussed the consequences of untreated sleep apnea. Advised not to drive drowsy for safety of patient and others. Will follow up in 3 months.    Atrial fibrillation Stable, on current management.   Morbid obesity Counseled patient on diet and lifestyle modification.    Patient  satisfied with Plan of action and management. All questions answered  I spent a total of 20 minutes reviewing chart data, face-to-face evaluation with the patient, counseling and coordination of care as detailed above.    Amiri Tritch, M.D.  Sleep [facility]

## 2024-04-21 ENCOUNTER — Telehealth: Payer: Self-pay

## 2024-04-21 NOTE — Telephone Encounter (Signed)
 Copied from CRM #[id number]. Topic: Clinical - Medical Advice >> Apr 21, 2024 11:24 AM Caleb Lopez wrote: Reason for CRM: Patient states he received his sleep apnea device but has trouble using it due to his schedule at work, he doesn't think the head piece is right for him and he's using the nasal device pillows - he doesn't think its going to work for him. Patient is requesting to come to the office to see if he can try one of the masks on.     Callback number: [phone]

## 2024-04-21 NOTE — Telephone Encounter (Signed)
 I spoke with the patient. He said he has broken nose multiple times and feels like that might be causing some of his problems. He has tried the nasal pillows in a large and small and he can not get a good seal with either one.  He is also having a hard time exhaling with the nasal pillow. He wants to come into the office and try a full face mask or another one that covers his mouth.   I did ask if he had reached out to [employer] and he said he went there to get the first mask and they did not have a good selection, so he does not want to go back there.    Dr. Jess are you ok with him coming in and being fitted for another mask?

## 2024-04-24 ENCOUNTER — Telehealth: Payer: Self-pay

## 2024-04-24 ENCOUNTER — Other Ambulatory Visit (HOSPITAL_COMMUNITY): Payer: Self-pay

## 2024-04-24 NOTE — Telephone Encounter (Signed)
 Pharmacy Patient Advocate Encounter   Received notification from Pt Calls Messages that prior authorization for Zepbound  2.5MG /0.5ML pen-injectors is required/requested.   Insurance verification completed.   The patient is insured through [employer] .   Per test claim: PA required; PA submitted to above mentioned insurance via CoverMyMeds Key/confirmation #/EOC [id number] Status is pending

## 2024-04-24 NOTE — Telephone Encounter (Signed)
 PA has been submitted, thanks

## 2024-04-25 NOTE — Telephone Encounter (Signed)
 PA has been approved

## 2024-04-25 NOTE — Telephone Encounter (Signed)
 Pharmacy Patient Advocate Encounter  Received notification from [employer] that Prior Authorization for Zepbound  2.5MG /0.5ML pen-injectors has been APPROVED from 04/24/2024 to 04/24/2025   PA #/Case ID/Reference #: [id number]

## 2024-04-28 NOTE — Telephone Encounter (Unsigned)
 Copied from CRM #[id number]. Topic: Clinical - Medical Advice >> Apr 28, 2024 12:16 PM Isabell A wrote: Reason for CRM: Patient is requesting to speak with someone directly in the office in regard to his CPAP mask.   Callback number: [phone]

## 2024-05-05 ENCOUNTER — Ambulatory Visit: Attending: Cardiology | Admitting: Cardiology

## 2024-05-05 ENCOUNTER — Encounter: Payer: Self-pay | Admitting: Cardiology

## 2024-05-05 VITALS — BP 112/76 | HR 64 | Ht 72.0 in | Wt 307.6 lb

## 2024-05-05 DIAGNOSIS — E7800 Pure hypercholesterolemia, unspecified: Secondary | ICD-10-CM | POA: Diagnosis not present

## 2024-05-05 DIAGNOSIS — D6869 Other thrombophilia: Secondary | ICD-10-CM

## 2024-05-05 DIAGNOSIS — I483 Typical atrial flutter: Secondary | ICD-10-CM | POA: Diagnosis not present

## 2024-05-05 MED ORDER — APIXABAN 5 MG PO TABS: 5.0000 mg | ORAL_TABLET | Freq: Two times a day (BID) | ORAL | 3 refills | Status: AC

## 2024-05-05 MED ORDER — ATORVASTATIN CALCIUM 40 MG PO TABS: 40.0000 mg | ORAL_TABLET | Freq: Every day | ORAL | 3 refills | Status: AC

## 2024-05-05 NOTE — Patient Instructions (Signed)
 Medication Instructions:  Your physician recommends that you continue on your current medications as directed. Please refer to the Current Medication list given to you today.    *If you need a refill on your cardiac medications before your next appointment, please call your pharmacy*  Lab Work: No labs ordered today    Testing/Procedures: No test ordered today   Follow-Up: At [facility], you and your health needs are our priority.  As part of our continuing mission to provide you with exceptional heart care, our providers are all part of one team.  This team includes your primary Cardiologist (physician) and Advanced Practice Providers or APPs (Physician Assistants and Nurse Practitioners) who all work together to provide you with the care you need, when you need it.  Your next appointment:   6 month(s)  Provider:   Ole Holts, MD or Suzann Riddle, NP

## 2024-05-05 NOTE — Progress Notes (Signed)
 Electrophysiology Clinic Note    Date:  05/05/2024  Patient ID:  Caleb Lopez, Caleb Lopez 06/18/1961, MRN [id number] PCP:  Donzella Lauraine SAILOR, DO  Cardiologist:  Deatrice Cage, MD   Electrophysiologist:  OLE ONEIDA HOLTS, MD  Electrophysiology APP:  Dalissa Lovin, NP     Discussed the use of AI scribe software for clinical note transcription with the patient, who gave verbal consent to proceed.   Patient Profile    Chief Complaint: Aflutter ablation follow-up  History of Present Illness: Caleb Lopez is a 63 y.o. male with PMH notable for aflutter, non-obs CAD, HLD, RBBB, OSA on CPAP; seen today for OLE ONEIDA HOLTS, MD for routine electrophysiology follow-up s/p AFlutter Ablation.  He is s/p Aflutter ablation w ablation along CTI on 03/24/2024 by Dr. HOLTS. Amiodarone  was stopped after ablation.   On follow-up today, he has had one ~57min episode of tachycardia since ablation. The patient uses a wrist monitor to assess his pulse, and while in the 60-80s range it does indicate that it is irregular. He continues to take eliquis  BID, no bleeding concerns. He has no current complaints related to his groin sites, though admits to not assessing them recently. He did have burning to inner L thigh after the procedure that has significantly improved.  His wife notes and is concerned about his intermittent lower extremity edema, especially after working 12 hour shifts.  He is having difficulty with his CPAP face mask, has tried several and struggling to use system. He is followed by pulm upstairs.   He denies chest pain, chest pressure, palpitations currently. No change in appetite or abd discomfort.   He questions whether he has any activity or exercise restriction   Arrhythmia/Device History Amiodarone      ROS:  Please see the history of present illness. All other systems are reviewed and otherwise negative.    Physical Exam    VS:  BP 112/76 (BP Location: Left Arm, Patient  Position: Sitting, Cuff Size: Normal)   Pulse 64   Ht 6' (1.829 m)   Wt (!) 307 lb 9.6 oz (139.5 kg)   SpO2 97%   BMI 41.72 kg/m  BMI: Body mass index is 41.72 kg/m.      Wt Readings from Last 3 Encounters:  05/05/24 (!) 307 lb 9.6 oz (139.5 kg)  04/14/24 (!) 314 lb 6.4 oz (142.6 kg)  03/24/24 (!) 312 lb (141.5 kg)     GEN- The patient is well appearing, alert and oriented x 3 today.   Lungs- Clear to ausculation bilaterally, normal work of breathing.  Heart- Regular rate and rhythm, no murmurs, rubs or gallops Extremities- No peripheral edema, warm, dry. Bilateral groin sites are well-healed, c/d/i    Studies Reviewed   Previous EP, cardiology notes.    EKG is ordered. Personal review of EKG from today shows:    EKG Interpretation Date/Time:  Monday May 05 2024 13:22:55 EDT Ventricular Rate:  64 PR Interval:  186 QRS Duration:  150 QT Interval:  460 QTC Calculation: 474 R Axis:   -14  Text Interpretation: Normal sinus rhythm Right bundle branch block Confirmed by Clemma Johnsen [phone]) on 05/05/2024 1:27:38 PM    TTE, 03/24/2024  1. The left ventricle has no obvious regional wall motion abnormalities (Definity was not used - see sonographer's comments). Left ventricular diastolic function could not be evaluated (secondary to atrial flutter). Left ventricular ejection fraction, by estimation, is 55 to 60%. The left ventricle has normal  function. There is mild left ventricular hypertrophy.   2. Right ventricular systolic function is normal. The right ventricular size is mildly enlarged. Tricuspid regurgitation signal is inadequate for assessing PA pressure.   3. Right atrial size was mildly dilated.   4. Trivial pericardial effusion is present. The pericardial effusion is anterior to the right ventricle. There is no evidence of cardiac tamponade.   5. The mitral valve is normal in structure. No evidence of mitral valve regurgitation. No evidence of mitral stenosis.   6.  The aortic valve is tricuspid. Aortic valve regurgitation is not visualized. No aortic stenosis is present.   7. The inferior vena cava is normal in size with <50% respiratory variability, suggesting right atrial pressure of 8 mmHg.   8. Ascending aorta measurements are within normal limits for age when indexed to body surface area.   TEE, 02/15/2024  1. Left ventricular ejection fraction, by estimation, is 65 to 70%. The left ventricle has normal function. The left ventricle has no regional wall motion abnormalities. There is mild concentric left ventricular hypertrophy.   2. Right ventricular systolic function is normal. The right ventricular size is normal.   3. Left atrial size was moderately dilated. No left atrial/left atrial appendage thrombus was detected.   4. The mitral valve is normal in structure. Trivial mitral valve regurgitation. No evidence of mitral stenosis.   5. The aortic valve is normal in structure. Aortic valve regurgitation is not visualized. No aortic stenosis is present.   6. There is mild (Grade II) layered plaque involving the descending aorta.   7. The inferior vena cava is normal in size with greater than 50% respiratory variability, suggesting right atrial pressure of 3 mmHg.   Conclusion(s)/Recommendation(s): Normal biventricular function without  evidence of hemodynamically significant valvular heart disease. No LA/LAA thrombus identified. Successful cardioversion performed with restoration of normal sinus rhythm.    Assessment and Plan     #) Aflutter S/p Aflutter ablation 7/24 by Dr. Cindie Amiodarone  stopped He had one episode of tachycardia since ablation that was very brief in duration ~30 minutes Groin sites are well-healed No activity or exercise limitations   #) Hypercoag d/t aflutter CHA2DS2-VASc Score = at least 1 [CHF History: 0, HTN History: 0, Diabetes History: 0, Stroke History: 0, Vascular Disease History: 1, Age Score: 0, Gender Score: 0].   Therefore, the patient's annual risk of stroke is 0.6 %.    Stroke ppx - 5mg  eliquis  BID, appropriately dosed No bleeding concerns Continue eliquis  at this time, do not recommend pausing for elective procedures for 3 mon post-ablation (06/24/2024) Will re-assess appropriateness of ongoing eliquis  at next appt  #) OSA on CPAP Mgmt by Pulm Recommend he continue to work with pulm on device adjustments Notify provider regarding face masks difficulties We discussed untreated OSA and afib/flutter connection     Current medicines are reviewed at length with the patient today.   The patient does not have concerns regarding his medicines.  The following changes were made today:  none  Labs/ tests ordered today include:  Orders Placed This Encounter  Procedures   EKG 12-Lead     Disposition: Follow up with Dr. Cindie or EP APP in 6 months   Signed, Ginelle Bays, NP  05/05/24  2:02 PM  Electrophysiology [facility]

## 2024-05-06 NOTE — Telephone Encounter (Signed)
 Pt lvm on 05/05/24 stated that he would not be able to have his colonoscopy due to not being able to stop his Eliquis .  Call returned to patient to make him aware that his message was received and his colonoscopy has been canceled for 06/05/24.  Thanks,  [place], CMA

## 2024-05-08 MED ORDER — TIRZEPATIDE-WEIGHT MANAGEMENT 2.5 MG/0.5ML ~~LOC~~ SOAJ: 2.5000 mg | SUBCUTANEOUS | 2 refills | Status: DC

## 2024-05-08 NOTE — Addendum Note (Signed)
 Addended by: DONZELLA DOMINO on: 05/08/2024 08:33 AM   Modules accepted: Orders

## 2024-06-03 ENCOUNTER — Ambulatory Visit: Admitting: Family Medicine

## 2024-06-03 ENCOUNTER — Encounter: Payer: Self-pay | Admitting: Family Medicine

## 2024-06-03 VITALS — BP 109/64 | HR 62 | Ht 72.0 in | Wt 304.0 lb

## 2024-06-03 DIAGNOSIS — Z0000 Encounter for general adult medical examination without abnormal findings: Secondary | ICD-10-CM

## 2024-06-03 DIAGNOSIS — R600 Localized edema: Secondary | ICD-10-CM

## 2024-06-03 DIAGNOSIS — R7309 Other abnormal glucose: Secondary | ICD-10-CM

## 2024-06-03 DIAGNOSIS — G4733 Obstructive sleep apnea (adult) (pediatric): Secondary | ICD-10-CM | POA: Diagnosis not present

## 2024-06-03 DIAGNOSIS — Z0001 Encounter for general adult medical examination with abnormal findings: Secondary | ICD-10-CM | POA: Diagnosis not present

## 2024-06-03 DIAGNOSIS — B356 Tinea cruris: Secondary | ICD-10-CM

## 2024-06-03 DIAGNOSIS — Z23 Encounter for immunization: Secondary | ICD-10-CM

## 2024-06-03 MED ORDER — TERBINAFINE HCL 1 % EX CREA: 1.0000 | TOPICAL_CREAM | Freq: Two times a day (BID) | CUTANEOUS | 2 refills | Status: DC

## 2024-06-03 MED ORDER — TIRZEPATIDE-WEIGHT MANAGEMENT 2.5 MG/0.5ML ~~LOC~~ SOAJ
2.5000 mg | SUBCUTANEOUS | 2 refills | Status: DC
Start: 2024-06-03 — End: 2024-06-25

## 2024-06-03 NOTE — Progress Notes (Signed)
 Complete physical exam   Patient: Caleb Lopez   DOB: [birth year]   63 y.o. Male  MRN: [id number] Visit Date: 06/03/2024  Today's healthcare provider: LAURAINE LOISE BUOY, DO   Chief Complaint  Patient presents with   Annual Exam    Patient here for physical. He reports feeling well. He is exercising some. He walks and do some stretches.. He is sleeping well.   Medical Management of Chronic Issues    Follow-up DM, weight and OSA.   Subjective    Caleb Lopez is a 63 y.o. male who presents today for a complete physical exam.   HPI HPI     Annual Exam    Additional comments: Patient here for physical. He reports feeling well. He is exercising some. He walks and do some stretches.. He is sleeping well.        Medical Management of Chronic Issues    Additional comments: Follow-up DM, weight and OSA.      Last edited by Rosas, Joseline E, CMA on 06/03/2024  2:00 PM.      Caleb Lopez is a 63 year old male who presents for an annual physical exam.  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 The swelling decreases when he takes a few days off work. He has not used compression socks due to sizing issues.  He underwent an ablation in July and has been doing well since then. He monitors his heart rate with a wrist device and has not noticed any irregularities, with his heart rate mostly in the mid-seventies. No chest pain, shortness of breath, lightheadedness, dizziness, or headaches since the ablation. He notes an improvement in symptoms such as seeing stars when bending over, which he had experienced for years prior to the procedure.  He has experienced issues with obtaining tirzepatide  due not being able to receive it from the pharmacy yet (related to prior authorization). He has not been able to start the medication, and his wife has been  involved in trying to resolve the issue.   He has a history of CPAP use for sleep apnea but discontinued it due to discomfort and issues with the mask.  He has not taken terbinafine  cream due to insurance coverage issues and mentions using an acid-type soap and blow-drying the affected area as part of his current regimen.  He has not had a colonoscopy due to a previous cancellation related to his ablation. He does not consume alcohol and practices a personal exercise routine he refers to as 'Jim-chi' (like [place] chi) to maintain flexibility and prevent impingement syndrome.      Past Medical History:  Diagnosis Date   Borderline diabetes    CAD (coronary artery disease)    a. 07/2008 NSTEMI/Cath ([facility]): LM nl, LAD 60, LCX nl, RCA nl, EF 55%->Med rx. Presentation felt to be secondary to pericarditis.   Morbid obesity (HCC)    Pericarditis 07/2008   RBBB    Past Surgical History:  Procedure Laterality Date   A-FLUTTER ABLATION N/A 03/24/2024   Procedure: A-FLUTTER ABLATION;  Surgeon: Cindie Ole DASEN, MD;  Location: [facility] INVASIVE CV LAB;  Service: Cardiovascular;  Laterality: N/A;   CARDIOVERSION N/A 02/15/2024   Procedure: CARDIOVERSION;  Surgeon: Cherrie Toribio SAUNDERS, MD;  Location: [facility] ORS;  Service: Cardiovascular;  Laterality: N/A;   COLONOSCOPY WITH  PROPOFOL  N/A 06/03/2019   Procedure: COLONOSCOPY WITH PROPOFOL ;  Surgeon: Jinny Carmine, MD;  Location: [facility] ENDOSCOPY;  Service: Endoscopy;  Laterality: N/A;   TEE WITHOUT CARDIOVERSION N/A 02/15/2024   Procedure: ECHOCARDIOGRAM, TRANSESOPHAGEAL;  Surgeon: Cherrie Toribio SAUNDERS, MD;  Location: [facility] ORS;  Service: Cardiovascular;  Laterality: N/A;   THORACENTESIS  1980'S   x's 2 probably infection   TONSILLECTOMY AND ADENOIDECTOMY  1960'S   Social History   Socioeconomic History   Marital status: Married    Spouse name: Not on file   Number of children: Not on file   Years of education: Not on file   Highest education level: Not on file   Occupational History   Not on file  Tobacco Use   Smoking status: Former    Current packs/day: 0.00    Types: Cigarettes    Quit date: 09/11/1988    Years since quitting: 35.7   Smokeless tobacco: Never  Vaping Use   Vaping status: Never Used  Substance and Sexual Activity   Alcohol use: Yes    Comment: occasional   Drug use: Never   Sexual activity: Not on file  Other Topics Concern   Not on file  Social History Narrative   Not on file   Social Drivers of Health   Financial Resource Strain: Not on file  Food Insecurity: No Food Insecurity (06/03/2024)   Hunger Vital Sign    Worried About Running Out of Food in the Last Year: Never true    Ran Out of Food in the Last Year: Never true  Transportation Needs: No Transportation Needs (02/14/2024)   PRAPARE - Administrator, Civil Service (Medical): No    Lack of Transportation (Non-Medical): No  Physical Activity: Not on file  Stress: Not on file  Social Connections: Not on file  Intimate Partner Violence: Not At Risk (02/14/2024)   Humiliation, Afraid, Rape, and Kick questionnaire    Fear of Current or Ex-Partner: No    Emotionally Abused: No    Physically Abused: No    Sexually Abused: No   Family Status  Relation Name Status   Mother  Alive   Father  Alive   Sister  Alive       pituitary adenoma  No partnership data on file   Family History  Problem Relation Age of Onset   Diverticulitis Mother    Arthritis Mother    Glaucoma Mother    Diabetes Father    No Known Allergies  Patient Care Team: Kyani Simkin, Lauraine SAILOR, DO as PCP - General (Family Medicine) Darron Deatrice LABOR, MD as PCP - Cardiology (Cardiology) Cindie Ole DASEN, MD as PCP - Electrophysiology (Cardiology) Riddle, Suzann, NP as Nurse Practitioner (Clinical Cardiac Electrophysiology)   Medications: Outpatient Medications Prior to Visit  Medication Sig   apixaban  (ELIQUIS ) 5 MG TABS tablet Take 1 tablet (5 mg total) by mouth 2 (two) times  daily.   atorvastatin  (LIPITOR) 40 MG tablet Take 1 tablet (40 mg total) by mouth daily.   Krill Oil 500 MG CAPS Take 1,000 mg by mouth daily at 12 noon. With Atorvastatin    [DISCONTINUED] terbinafine  (LAMISIL ) 1 % cream Apply 1 Application topically 2 (two) times daily.   [DISCONTINUED] tirzepatide  (ZEPBOUND ) 2.5 MG/0.5ML Pen Inject 2.5 mg into the skin once a week. (Patient not taking: Reported on 06/03/2024)   [DISCONTINUED] tirzepatide  (ZEPBOUND ) 5 MG/0.5ML Pen Inject 5 mg into the skin once a week. (Patient not taking: Reported on 06/03/2024)  No facility-administered medications prior to visit.    Review of Systems  Constitutional:  Negative for appetite change, chills, fatigue and fever.  HENT:  Negative for congestion, ear pain, hearing loss, nosebleeds and trouble swallowing.   Eyes:  Negative for pain and visual disturbance.  Respiratory:  Negative for cough, chest tightness and shortness of breath.   Cardiovascular:  Negative for chest pain, palpitations and leg swelling.  Gastrointestinal:  Negative for abdominal pain, blood in stool, constipation, diarrhea, nausea and vomiting.  Endocrine: Negative for polydipsia, polyphagia and polyuria.  Genitourinary:  Negative for dysuria and flank pain.  Musculoskeletal:  Negative for arthralgias, back pain, joint swelling, myalgias and neck stiffness.  Skin:  Negative for color change, rash and wound.  Neurological:  Negative for dizziness, tremors, seizures, speech difficulty, weakness, light-headedness and headaches.  Psychiatric/Behavioral:  Negative for behavioral problems, confusion, decreased concentration, dysphoric mood and sleep disturbance. The patient is not nervous/anxious.   All other systems reviewed and are negative.      Objective    BP 109/64 (BP Location: Left Arm, Patient Position: Sitting, Cuff Size: Large)   Pulse 62   Ht 6' (1.829 m)   Wt (!) 304 lb (137.9 kg)   SpO2 96%   BMI 41.23 kg/m    Physical  Exam Vitals and nursing note reviewed.  Constitutional:      General: He is awake.     Appearance: Normal appearance.  HENT:     Head: Normocephalic and atraumatic.     Right Ear: Tympanic membrane, ear canal and external ear normal.     Left Ear: Tympanic membrane, ear canal and external ear normal.     Nose: Nose normal.     Mouth/Throat:     Mouth: Mucous membranes are moist.     Pharynx: Oropharynx is clear. No oropharyngeal exudate or posterior oropharyngeal erythema.  Eyes:     General: No scleral icterus.    Extraocular Movements: Extraocular movements intact.     Conjunctiva/sclera: Conjunctivae normal.     Pupils: Pupils are equal, round, and reactive to light.  Neck:     Thyroid : No thyromegaly or thyroid  tenderness.  Cardiovascular:     Rate and Rhythm: Normal rate and regular rhythm.     Pulses: Normal pulses.     Heart sounds: Normal heart sounds.  Pulmonary:     Effort: Pulmonary effort is normal. No tachypnea, bradypnea or respiratory distress.     Breath sounds: Normal breath sounds. No stridor. No wheezing, rhonchi or rales.  Abdominal:     General: Bowel sounds are normal. There is no distension.     Palpations: Abdomen is soft. There is no mass.     Tenderness: There is no abdominal tenderness. There is no guarding.     Hernia: No hernia is present.  Musculoskeletal:     Cervical back: Normal range of motion and neck supple.     Right lower leg: Edema (trace) present.     Left lower leg: Edema (+1) present.  Lymphadenopathy:     Cervical: No cervical adenopathy.  Skin:    General: Skin is warm and dry.  Neurological:     Mental Status: He is alert and oriented to person, place, and time. Mental status is at baseline.  Psychiatric:        Mood and Affect: Mood normal.        Behavior: Behavior normal.      Last depression screening scores    06/03/2024  2:13 PM 01/02/2024    2:12 PM 06/05/2023    1:52 PM  PHQ 2/9 Scores  PHQ - 2 Score 0 0 0    Last fall risk screening    06/03/2024    2:13 PM  Fall Risk   Falls in the past year? 0  Number falls in past yr: 0  Injury with Fall? 0  Risk for fall due to : No Fall Risks   Last Audit-C alcohol use screening    06/03/2024    3:10 PM  Alcohol Use Disorder Test (AUDIT)  1. How often do you have a drink containing alcohol? 0  2. How many drinks containing alcohol do you have on a typical day when you are drinking? 0  3. How often do you have six or more drinks on one occasion? 0  AUDIT-C Score 0   A score of 3 or more in women, and 4 or more in men indicates increased risk for alcohol abuse, EXCEPT if all of the points are from question 1   No results found for any visits on 06/03/24.  Assessment & Plan    Routine Health Maintenance and Physical Exam  Exercise Activities and Dietary recommendations  Goals   None     Immunization History  Administered Date(s) Administered   INFLUENZA, HIGH DOSE SEASONAL PF 06/03/2024   Influenza, Seasonal, Injecte, Preservative Fre 06/05/2023   Influenza,inj,Quad PF,6+ Mos 07/30/2014, 07/04/2017, 06/06/2018, 07/11/2019, 06/21/2020, 08/08/2021, 05/31/2022   PNEUMOCOCCAL CONJUGATE-20 01/02/2024   Tdap 04/29/2012, 01/02/2024   Zoster Recombinant(Shingrix ) 03/29/2022, 05/31/2022   Zoster, Live 04/29/2012    Health Maintenance  Topic Date Due   Medicare Annual Wellness (AWV)  Never done   Colonoscopy  06/02/2024   DTaP/Tdap/Td (3 - Td or Tdap) 01/01/2034   Pneumococcal Vaccine: 50+ Years  Completed   Influenza Vaccine  Completed   Hepatitis C Screening  Completed   HIV Screening  Completed   Zoster Vaccines- Shingrix   Completed   Hepatitis B Vaccines 19-59 Average Risk  Aged Out   HPV VACCINES  Aged Out   Meningococcal B Vaccine  Aged Out   COVID-19 Vaccine  Discontinued    Discussed health benefits of physical activity, and encouraged him to engage in regular exercise appropriate for his age and condition.   Annual  physical exam  Obstructive sleep apnea -     Tirzepatide -Weight Management; Inject 2.5 mg into the skin once a week.  Dispense: 2 mL; Refill: 2  Bilateral lower extremity edema -     Ambulatory referral to Vascular Surgery  Elevated hemoglobin A1c  Tinea cruris -     Terbinafine  HCl; Apply 1 Application topically 2 (two) times daily.  Dispense: 42 g; Refill: 2  Immunization due -     Flu vaccine HIGH DOSE PF(Fluzone Trivalent)     Annual physical exam Routine wellness visit. Physical exam overall unremarkable except as noted above. Routine lab work ordered as noted.    Obstructive sleep apnea Intolerance to CPAP therapy, exploring dental mouthpiece for airway patency. - Proceed with dental appointment for mouthpiece fitting. - Send prescription for tirzepatide  to [facility], as that is where the prior authorization approval originated.  Bilateral lower extremity swelling Chronic leg and foot swelling, work-related (worse in dependent position).  Pain, no compression socks due to size issues. - Refer to vascular surgery for evaluation.  Tinea cruris Suspected onychomycosis, previous terbinafine  prescription not filled due to cost. - Resend prescription for terbinafine  to pharmacy. - Check  Medicare coverage for terbinafine .  Elevated hemoglobin A1c Monitoring abnormal glucose, A1c testing deferred. - Defer A1c testing.    Return in about 3 months (around 09/02/2024) for Welcome to Medicare exam.     I discussed the assessment and treatment plan with the patient  The patient was provided an opportunity to ask questions and all were answered. The patient agreed with the plan and demonstrated an understanding of the instructions.   The patient was advised to call back or seek an in-person evaluation if the symptoms worsen or if the condition fails to improve as anticipated.    LAURAINE LOISE BUOY, DO  [facility] [phone] (phone) [phone]  (fax)  [facility]

## 2024-06-04 ENCOUNTER — Encounter: Payer: Managed Care, Other (non HMO) | Admitting: Family Medicine

## 2024-06-05 ENCOUNTER — Telehealth: Payer: Self-pay

## 2024-06-05 ENCOUNTER — Ambulatory Visit: Admit: 2024-06-05 | Admitting: Gastroenterology

## 2024-06-05 SURGERY — COLONOSCOPY
Anesthesia: General

## 2024-06-05 NOTE — Telephone Encounter (Signed)
 Pt contacted office requesting to re- schedule his colonoscopy with Dr. Jinny at [facility] for the month of December.    He canceled his 06/05/24 procedure on 05/05/24 due to not being able to stop eliquis .  I informed him that I will call him back to reschedule his procedure with Dr. Jinny once the scope schedule opens and I've received clearance.  Thanks,  [place], CMA

## 2024-06-09 ENCOUNTER — Telehealth: Payer: Self-pay | Admitting: Family Medicine

## 2024-06-09 NOTE — Telephone Encounter (Signed)
 Called left detailed vm to continue same dose same day each week, there is 2 refills once he is on last refill call to let us  know and we can ask Dr. Donzella to see about increasing to next dose.

## 2024-06-09 NOTE — Telephone Encounter (Signed)
 Copied from CRM [id number]. Topic: Clinical - Prescription Issue >> Jun 09, 2024  2:41 PM Caleb Lopez wrote: Reason for CRM: Patient has questions about how to continue to take Zepbound . Had his first dose today and is reporting no complications. Patient needs to know will the next box be an increase dosage and if so, how much of an increase. CB# [phone]

## 2024-06-17 ENCOUNTER — Encounter (INDEPENDENT_AMBULATORY_CARE_PROVIDER_SITE_OTHER): Admitting: Nurse Practitioner

## 2024-06-18 ENCOUNTER — Ambulatory Visit (INDEPENDENT_AMBULATORY_CARE_PROVIDER_SITE_OTHER): Admitting: Vascular Surgery

## 2024-06-18 ENCOUNTER — Encounter (INDEPENDENT_AMBULATORY_CARE_PROVIDER_SITE_OTHER): Payer: Self-pay | Admitting: Vascular Surgery

## 2024-06-18 VITALS — BP 99/67 | HR 76 | Resp 18 | Ht 72.0 in | Wt 306.6 lb

## 2024-06-18 DIAGNOSIS — E782 Mixed hyperlipidemia: Secondary | ICD-10-CM | POA: Diagnosis not present

## 2024-06-18 DIAGNOSIS — I4891 Unspecified atrial fibrillation: Secondary | ICD-10-CM | POA: Diagnosis not present

## 2024-06-18 DIAGNOSIS — I890 Lymphedema, not elsewhere classified: Secondary | ICD-10-CM | POA: Diagnosis not present

## 2024-06-18 DIAGNOSIS — R7309 Other abnormal glucose: Secondary | ICD-10-CM

## 2024-06-18 NOTE — Progress Notes (Signed)
 Subjective:    Patient ID: Caleb Lopez, male    DOB: 07-Apr-1961, 63 y.o.   MRN: [id number] Chief Complaint  Patient presents with   Establish Care    New patient consult Bilateral Lower extremity edema ref. pardue    Caleb Lopez is a 63 yo male who presents to clinic today with chief complaint of bilateral lower extremities.  He denies any pain or discomfort at this time.  He endorses soreness and achiness as he walks long distances.  He has no open sores or skin breakdown at this point.  Patient is morbidly obese.  Patient endorses he sits at his desk all day at work and does not really ambulate.  Patient endorses he is not doing any exercise currently.  Patient endorses he has not tried any conventional therapies such as compression socks, rest, elevation and exercise.    Review of Systems  Constitutional: Negative.   Cardiovascular:  Positive for leg swelling.  Musculoskeletal:  Positive for myalgias.  All other systems reviewed and are negative.      Objective:   Physical Exam Vitals reviewed.  Constitutional:      Appearance: Normal appearance. He is obese.  HENT:     Head: Normocephalic.  Eyes:     Pupils: Pupils are equal, round, and reactive to light.  Cardiovascular:     Rate and Rhythm: Normal rate. Rhythm irregular.     Pulses: Normal pulses.     Heart sounds: Normal heart sounds.  Pulmonary:     Effort: Pulmonary effort is normal.     Breath sounds: Normal breath sounds.  Abdominal:     General: Bowel sounds are normal.     Palpations: Abdomen is soft.  Musculoskeletal:     Right lower leg: Edema present.     Left lower leg: Edema present.  Skin:    General: Skin is warm and dry.     Capillary Refill: Capillary refill takes 2 to 3 seconds.  Neurological:     General: No focal deficit present.     Mental Status: He is alert and oriented to person, place, and time. Mental status is at baseline.  Psychiatric:        Mood and Affect: Mood normal.         Behavior: Behavior normal.        Thought Content: Thought content normal.        Judgment: Judgment normal.     BP 99/67 (BP Location: Left Arm, Patient Position: Sitting, Cuff Size: Large)   Pulse 76   Resp 18   Ht 6' (1.829 m)   Wt (!) 306 lb 9.6 oz (139.1 kg)   BMI 41.58 kg/m   Past Medical History:  Diagnosis Date   Borderline diabetes    CAD (coronary artery disease)    a. 07/2008 NSTEMI/Cath ([facility]): LM nl, LAD 60, LCX nl, RCA nl, EF 55%->Med rx. Presentation felt to be secondary to pericarditis.   Morbid obesity (HCC)    Pericarditis 07/2008   RBBB     Social History   Socioeconomic History   Marital status: Married    Spouse name: Not on file   Number of children: Not on file   Years of education: Not on file   Highest education level: Not on file  Occupational History   Not on file  Tobacco Use   Smoking status: Former    Current packs/day: 0.00    Types: Cigarettes    Quit date:  09/11/1988    Years since quitting: 35.7   Smokeless tobacco: Never  Vaping Use   Vaping status: Never Used  Substance and Sexual Activity   Alcohol use: Yes    Comment: occasional   Drug use: Not Currently   Sexual activity: Yes  Other Topics Concern   Not on file  Social History Narrative   Not on file   Social Drivers of Health   Financial Resource Strain: Not on file  Food Insecurity: No Food Insecurity (06/03/2024)   Hunger Vital Sign    Worried About Running Out of Food in the Last Year: Never true    Ran Out of Food in the Last Year: Never true  Transportation Needs: No Transportation Needs (02/14/2024)   PRAPARE - Administrator, Civil Service (Medical): No    Lack of Transportation (Non-Medical): No  Physical Activity: Not on file  Stress: Not on file  Social Connections: Not on file  Intimate Partner Violence: Not At Risk (02/14/2024)   Humiliation, Afraid, Rape, and Kick questionnaire    Fear of Current or Ex-Partner: No    Emotionally Abused:  No    Physically Abused: No    Sexually Abused: No    Past Surgical History:  Procedure Laterality Date   A-FLUTTER ABLATION N/A 03/24/2024   Procedure: A-FLUTTER ABLATION;  Surgeon: Cindie Ole DASEN, MD;  Location: [facility] INVASIVE CV LAB;  Service: Cardiovascular;  Laterality: N/A;   CARDIOVERSION N/A 02/15/2024   Procedure: CARDIOVERSION;  Surgeon: Cherrie Toribio SAUNDERS, MD;  Location: [facility] ORS;  Service: Cardiovascular;  Laterality: N/A;   COLONOSCOPY WITH PROPOFOL  N/A 06/03/2019   Procedure: COLONOSCOPY WITH PROPOFOL ;  Surgeon: Jinny Carmine, MD;  Location: [facility] ENDOSCOPY;  Service: Endoscopy;  Laterality: N/A;   TEE WITHOUT CARDIOVERSION N/A 02/15/2024   Procedure: ECHOCARDIOGRAM, TRANSESOPHAGEAL;  Surgeon: Cherrie Toribio SAUNDERS, MD;  Location: [facility] ORS;  Service: Cardiovascular;  Laterality: N/A;   THORACENTESIS  1980'S   x's 2 probably infection   TONSILLECTOMY AND ADENOIDECTOMY  1960'S    Family History  Problem Relation Age of Onset   Diverticulitis Mother    Arthritis Mother    Glaucoma Mother    Diabetes Father     No Known Allergies     Latest Ref Rng & Units 03/05/2024    3:40 PM 02/15/2024    2:30 AM 02/14/2024   12:36 PM  CBC  WBC 3.4 - 10.8 x10E3/uL 7.3  7.9  8.6   Hemoglobin 13.0 - 17.7 g/dL 84.1  84.7  83.6   Hematocrit 37.5 - 51.0 % 46.8  45.0  49.5   Platelets 150 - 450 x10E3/uL 267  235  260       CMP     Component Value Date/Time   NA 140 03/05/2024 1540   NA 142 05/27/2012 1122   K 4.4 03/05/2024 1540   K 3.8 05/27/2012 1122   CL 105 03/05/2024 1540   CL 110 (H) 05/27/2012 1122   CO2 19 (L) 03/05/2024 1540   CO2 25 05/27/2012 1122   GLUCOSE 101 (H) 03/05/2024 1540   GLUCOSE 114 (H) 02/15/2024 0230   GLUCOSE 85 05/27/2012 1122   BUN 20 03/05/2024 1540   BUN 14 05/27/2012 1122   CREATININE 1.08 03/05/2024 1540   CREATININE 0.73 05/27/2012 1122   CALCIUM  10.2 03/05/2024 1540   CALCIUM  9.2 05/27/2012 1122   PROT 6.2 (L) 02/15/2024 1615   PROT 7.0  01/02/2024 1501   ALBUMIN 3.5 02/15/2024 1615  ALBUMIN 4.4 01/02/2024 1501   AST 19 02/15/2024 1615   ALT 22 02/15/2024 1615   ALKPHOS 70 02/15/2024 1615   BILITOT 0.7 02/15/2024 1615   BILITOT 0.5 01/02/2024 1501   EGFR 76 03/05/2024 1540   GFRNONAA >60 02/15/2024 0230   GFRNONAA >60 05/27/2012 1122     No results found.     Assessment & Plan:   1. Lymphedema (Primary) Recommend:  I have had a long discussion with the patient regarding swelling and why it  causes symptoms.  Patient will begin wearing graduated compression on a daily basis a prescription was given. The patient will  wear the stockings first thing in the morning and removing them in the evening. The patient is instructed specifically not to sleep in the stockings.   In addition, behavioral modification will be initiated.  This will include frequent elevation, use of over the counter pain medications and exercise such as walking.  Consideration for a lymph pump will also be made based upon the effectiveness of conservative therapy.  This would help to improve the edema control and prevent sequela such as ulcers and infections   Patient should undergo duplex ultrasound of the venous system to ensure that DVT or reflux is not present.  The patient will follow-up with me PRN as requested by the patient.    2. Atrial fibrillation with RVR (HCC) Continue antiarrhythmia medications as already ordered, these medications have been reviewed and there are no changes at this time.  Continue anticoagulation as ordered by Cardiology Service  3. Elevated hemoglobin A1c Continue hypoglycemic medications as already ordered, these medications have been reviewed and there are no changes at this time.  Hgb A1C to be monitored as already arranged by primary service  4. Moderate mixed hyperlipidemia not requiring statin therapy Continue statin as ordered and reviewed, no changes at this time  5. Morbid obesity (HCC) I had a  clinic conversation with the patient regarding his weight and how it affects the swelling in his lower extremities.  We discussed in detail diet and exercise and how this may help reduce some of the pressure to his lower extremities and some swelling.   Current Outpatient Medications on File Prior to Visit  Medication Sig Dispense Refill   apixaban  (ELIQUIS ) 5 MG TABS tablet Take 1 tablet (5 mg total) by mouth 2 (two) times daily. 180 tablet 3   atorvastatin  (LIPITOR) 40 MG tablet Take 1 tablet (40 mg total) by mouth daily. 90 tablet 3   Krill Oil 500 MG CAPS Take 1,000 mg by mouth daily at 12 noon. With Atorvastatin      tirzepatide  (ZEPBOUND ) 2.5 MG/0.5ML Pen Inject 2.5 mg into the skin once a week. 2 mL 2   terbinafine  (LAMISIL ) 1 % cream Apply 1 Application topically 2 (two) times daily. (Patient not taking: Reported on 06/18/2024) 42 g 2   No current facility-administered medications on file prior to visit.    There are no Patient Instructions on file for this visit. No follow-ups on file.   Gwendlyn JONELLE Shank, NP

## 2024-06-24 ENCOUNTER — Other Ambulatory Visit: Payer: Self-pay | Admitting: Family Medicine

## 2024-06-25 ENCOUNTER — Other Ambulatory Visit: Payer: Self-pay | Admitting: Family Medicine

## 2024-06-25 DIAGNOSIS — G4733 Obstructive sleep apnea (adult) (pediatric): Secondary | ICD-10-CM

## 2024-06-25 MED ORDER — ZEPBOUND 5 MG/0.5ML ~~LOC~~ SOAJ: 5.0000 mg | SUBCUTANEOUS | 2 refills | Status: DC

## 2024-07-01 ENCOUNTER — Telehealth: Payer: Self-pay

## 2024-07-01 NOTE — Telephone Encounter (Signed)
 Copied from CRM #[id number]. Topic: Clinical - Medical Advice >> Jul 01, 2024 10:49 AM Benton KIDD wrote: Reason for CRM: eva nurse case manager with [employer] calling about [place] . just talk to [facility] yesterday . he mentioned he has not been able to tolerate the cpap and mentioned dream tap its like oral dentist has to make a impression or mode . not sure if the doctor has ever ordered those and just trying to see if this is something that dr reddy could help with  [phone] ext [phone] Please give ms eva a call back to let her know if this is something that dr reddy could help with

## 2024-07-02 ENCOUNTER — Telehealth (INDEPENDENT_AMBULATORY_CARE_PROVIDER_SITE_OTHER): Payer: Self-pay

## 2024-07-02 NOTE — Telephone Encounter (Signed)
 Received call from [facility] requesting pt orders to be re faxed. Confirmed with her the fax number as [phone] will resend.

## 2024-07-07 ENCOUNTER — Telehealth (INDEPENDENT_AMBULATORY_CARE_PROVIDER_SITE_OTHER): Payer: Self-pay

## 2024-07-07 NOTE — Telephone Encounter (Signed)
 I have notified the patient and his wife. They do not want to do the inspire device. They only want to try the oral device.  Per Dr.Reddy- patient has severe sleep apnea and an oral device sutable for severe sleep apnea.  I have notified the patient and his wife. They do not want to try anything other options.  Nothing further needed.

## 2024-07-07 NOTE — Telephone Encounter (Signed)
 Samantha from [employer] called at this time in reference to MD notes needed for patients need for compressions. Faced over at this time to [phone]. Spoke with Lucie at this time to let her know the fax had been sent over and to let us  know if she needed anything else in reference to patient.

## 2024-07-14 NOTE — Telephone Encounter (Signed)
 LVM for pt to return my call to schedule his colonoscopy at [facility].  Thanks,  [place], CMA

## 2024-07-15 ENCOUNTER — Telehealth: Payer: Self-pay

## 2024-07-15 ENCOUNTER — Telehealth (HOSPITAL_BASED_OUTPATIENT_CLINIC_OR_DEPARTMENT_OTHER): Payer: Self-pay

## 2024-07-15 ENCOUNTER — Other Ambulatory Visit: Payer: Self-pay

## 2024-07-15 DIAGNOSIS — Z860100 Personal history of colon polyps, unspecified: Secondary | ICD-10-CM

## 2024-07-15 MED ORDER — NA SULFATE-K SULFATE-MG SULF 17.5-3.13-1.6 GM/177ML PO SOLN: 1.0000 | Freq: Once | ORAL | 0 refills | Status: AC

## 2024-07-15 NOTE — Telephone Encounter (Signed)
   Pre-operative Risk Assessment    Patient Name: Caleb Lopez  DOB: [birth year] MRN: [id number]   Date of last office visit: 05/05/2024 - Chantal Ridldle, NP  Date of next office visit: N/A   Request for Surgical Clearance    Procedure:  colonoscopy  Date of Surgery:  Clearance 09/23/24                                 Surgeon:  Dr. Clotilda Schaffer Surgeon's Group or Practice Name:  [facility] number:  [phone] or [phone] (Michelle's desk) Fax number:  [phone]   Type of Clearance Requested:   - Medical  - Pharmacy:  Hold Apixaban  (Eliquis ) _Needs instructions    Type of Anesthesia:  General    Additional requests/questions:  N/A  SignedPatrcia Iverson CROME   07/15/2024, 5:27 PM

## 2024-07-15 NOTE — Telephone Encounter (Signed)
 Gastroenterology Pre-Procedure Review   Request Date: 09/23/24 Requesting Physician: Dr. Melany   PATIENT REVIEW QUESTIONS: The patient responded to the following health history questions as indicated:     1. Are you having any GI issues? no 2. Do you have a personal history of Polyps? yes (last colonoscopy performed by dr jinny 06/03/19 recommended repeat in 5 years) 3. Do you have a family history of Colon Cancer or Polyps? no 4. Diabetes Mellitus? no 5. Joint replacements in the past 12 months?no 6. Major health problems in the past 3 months?no 7. Any artificial heart valves, MVP, or defibrillator?no 8. Cardiac issues? CAD, Atrial Fib, Atrial Flutter Cardiac clearance sent to [facility] for clearance.  Takes Eiquis-advice requested on cardiac clearance. 9. Weight medication? Zepbound  has been advised to stop 7 days prior noted on instructions Patient confirms/reports the following medications:  Current Outpatient Medications  Medication Sig Dispense Refill   apixaban  (ELIQUIS ) 5 MG TABS tablet Take 1 tablet (5 mg total) by mouth 2 (two) times daily. 180 tablet 3   atorvastatin  (LIPITOR) 40 MG tablet Take 1 tablet (40 mg total) by mouth daily. 90 tablet 3   Krill Oil 500 MG CAPS Take 1,000 mg by mouth daily at 12 noon. With Atorvastatin      terbinafine  (LAMISIL ) 1 % cream Apply 1 Application topically 2 (two) times daily. (Patient not taking: Reported on 06/18/2024) 42 g 2   tirzepatide  (ZEPBOUND ) 5 MG/0.5ML Pen Inject 5 mg into the skin once a week. 2 mL 2   No current facility-administered medications for this visit.    Patient confirms/reports the following allergies:  No Known Allergies  No orders of the defined types were placed in this encounter.   AUTHORIZATION INFORMATION Primary Insurance: 1D#: Group #:  Secondary Insurance: 1D#: Group #:  SCHEDULE INFORMATION: Date: 09/23/24 Time: Location: [facility]

## 2024-07-16 ENCOUNTER — Ambulatory Visit: Admitting: Sleep Medicine

## 2024-07-16 ENCOUNTER — Telehealth (HOSPITAL_BASED_OUTPATIENT_CLINIC_OR_DEPARTMENT_OTHER): Payer: Self-pay | Admitting: *Deleted

## 2024-07-16 NOTE — Telephone Encounter (Signed)
   Name: Caleb Lopez  DOB: 1961-03-01  MRN: [id number]  Primary Cardiologist: Deatrice Cage, MD   Preoperative team, please contact this patient and set up a phone call appointment for further preoperative risk assessment. Please obtain consent and complete medication review. Thank you for your help. Colonoscopy is scheduled for 09/23/2024.   I confirm that guidance regarding antiplatelet and oral anticoagulation therapy has been completed and, if necessary, noted below.  Reached out to pharmacy concerning Eliquis  hold.   I also confirmed the patient resides in the state of [place] . As per [facility] Medical Board telemedicine laws, the patient must reside in the state in which the provider is licensed.   Lamarr Satterfield, NP 07/16/2024, 11:13 AM [facility]

## 2024-07-16 NOTE — Telephone Encounter (Signed)
 Pharmacy please advise on holding Eliquis  prior to colonoscopy scheduled for 09/23/2024. Last labs 03/05/2024. Thank you.

## 2024-07-16 NOTE — Telephone Encounter (Signed)
 Pt has been scheduled tele preop appt 09/08/24. Med rec and consent are done.

## 2024-07-16 NOTE — Telephone Encounter (Signed)
 Pt has been scheduled tele preop appt 09/08/24. Med rec and consent are done.      Patient Consent for Virtual Visit        Caleb Lopez has provided verbal consent on 07/16/2024 for a virtual visit (video or telephone).   CONSENT FOR VIRTUAL VISIT FOR:  Caleb Lopez  By participating in this virtual visit I agree to the following:  I hereby voluntarily request, consent and authorize [facility] and its employed or contracted physicians, physician assistants, nurse practitioners or other licensed health care professionals (the Practitioner), to provide me with telemedicine health care services (the "Services) as deemed necessary by the treating Practitioner. I acknowledge and consent to receive the Services by the Practitioner via telemedicine. I understand that the telemedicine visit will involve communicating with the Practitioner through live audiovisual communication technology and the disclosure of certain medical information by electronic transmission. I acknowledge that I have been given the opportunity to request an in-person assessment or other available alternative prior to the telemedicine visit and am voluntarily participating in the telemedicine visit.  I understand that I have the right to withhold or withdraw my consent to the use of telemedicine in the course of my care at any time, without affecting my right to future care or treatment, and that the Practitioner or I may terminate the telemedicine visit at any time. I understand that I have the right to inspect all information obtained and/or recorded in the course of the telemedicine visit and may receive copies of available information for a reasonable fee.  I understand that some of the potential risks of receiving the Services via telemedicine include:  Delay or interruption in medical evaluation due to technological equipment failure or disruption; Information transmitted may not be sufficient (e.g. poor  resolution of images) to allow for appropriate medical decision making by the Practitioner; and/or  In rare instances, security protocols could fail, causing a breach of personal health information.  Furthermore, I acknowledge that it is my responsibility to provide information about my medical history, conditions and care that is complete and accurate to the best of my ability. I acknowledge that Practitioner's advice, recommendations, and/or decision may be based on factors not within their control, such as incomplete or inaccurate data provided by me or distortions of diagnostic images or specimens that may result from electronic transmissions. I understand that the practice of medicine is not an exact science and that Practitioner makes no warranties or guarantees regarding treatment outcomes. I acknowledge that a copy of this consent can be made available to me via my patient portal [facility] MyChart), or I can request a printed copy by calling the office of [facility].    I understand that my insurance will be billed for this visit.   I have read or had this consent read to me. I understand the contents of this consent, which adequately explains the benefits and risks of the Services being provided via telemedicine.  I have been provided ample opportunity to ask questions regarding this consent and the Services and have had my questions answered to my satisfaction. I give my informed consent for the services to be provided through the use of telemedicine in my medical care

## 2024-07-17 ENCOUNTER — Telehealth: Payer: Self-pay | Admitting: Family Medicine

## 2024-07-17 ENCOUNTER — Telehealth: Payer: Self-pay | Admitting: Cardiovascular Disease

## 2024-07-17 ENCOUNTER — Other Ambulatory Visit (HOSPITAL_COMMUNITY): Payer: Self-pay

## 2024-07-17 NOTE — Telephone Encounter (Signed)
 Based on test claims, patient has a high deductible Medicare plan. Xarelto  is $534.66 for 1 month. Unfortunately, all the PAP options for DOAC's require patient to meet a 3-4% out of pocket spending on drugs for 2025 based on their annual income. Patient won't meet any PAP requirements until once they have paid some towards their deductible. Patient would likely benefit from setting up a Medicare payment plan with their insurance. Patient can contact the customer service number on the back of their insurance card and request to setup a payment plan where the deductible would be split up into payments over time.

## 2024-07-17 NOTE — Telephone Encounter (Signed)
 Pt made aware and voiced he will stay with eliquis  and contact insurance company for deductible payment arrangement.

## 2024-07-17 NOTE — Telephone Encounter (Signed)
 [facility] faxed refill request for the following medications:  tirzepatide  (ZEPBOUND ) 5 MG/0.5ML Pen    Please advise.

## 2024-07-17 NOTE — Telephone Encounter (Signed)
 Pt c/o medication issue:  1. Name of Medication:   apixaban  (ELIQUIS ) 5 MG TABS tablet   2. How are you currently taking this medication (dosage and times per day)?   As prescribed  3. Are you having a reaction (difficulty breathing--STAT)?   4. What is your medication issue?   Wife Romero) stated they are in open enrollment period and this medication will no longer be covered by Medicaid.  Wife wants to know if there is alternate medication option.

## 2024-07-18 ENCOUNTER — Other Ambulatory Visit: Payer: Self-pay

## 2024-07-18 DIAGNOSIS — G4733 Obstructive sleep apnea (adult) (pediatric): Secondary | ICD-10-CM

## 2024-07-18 NOTE — Telephone Encounter (Signed)
 LOV- 06/03/2024 NOV- 09/08/2024 LRF-

## 2024-07-21 NOTE — Telephone Encounter (Signed)
 Outpatient Medication Detail   Disp Refills Start End   tirzepatide  (ZEPBOUND ) 5 MG/0.5ML Pen 2 mL 2 06/25/2024 --   Sig - Route: Inject 5 mg into the skin once a week. - Subcutaneous   Sent to pharmacy as: tirzepatide  (ZEPBOUND ) 5 MG/0.5ML Pen   E-Prescribing Status: Receipt confirmed by pharmacy (06/25/2024 10:35 PM EDT)

## 2024-07-22 NOTE — Telephone Encounter (Signed)
 [facility] is requesting refills on Zepbound  pen

## 2024-07-22 NOTE — Telephone Encounter (Signed)
 This was sent to [facility] on 06/25/2024    Outpatient Medication Detail   Disp Refills Start End   tirzepatide  (ZEPBOUND ) 5 MG/0.5ML Pen 2 mL 2 06/25/2024 --   Sig - Route: Inject 5 mg into the skin once a week. - Subcutaneous   Sent to pharmacy as: tirzepatide  (ZEPBOUND ) 5 MG/0.5ML Pen   E-Prescribing Status: Receipt confirmed by pharmacy (06/25/2024 10:35 PM EDT)

## 2024-07-29 ENCOUNTER — Telehealth: Payer: Self-pay

## 2024-07-29 NOTE — Telephone Encounter (Signed)
 Pt called stating that he had a voicemail from our office and did not recall the name of there person who called... I did not see a note or anything showing there was correspondence from today

## 2024-08-06 NOTE — Telephone Encounter (Signed)
 Patient with diagnosis of Aflutter on Eliquis  for anticoagulation.    Procedure: colonoscopy  Date of procedure: 09/23/2024   CHA2DS2-VASc Score = 2   This indicates a 2.2% annual risk of stroke. The patient's score is based upon: CHF History: 0 HTN History: 0 Diabetes History: 0 Stroke History: 0 Vascular Disease History: 1 Age Score: 1 Gender Score: 0     CrCl 134 mL/min Platelet count 267 K   Patient has not  had an Afib/aflutter ablation in the last 3 months, DCCV within the last 4 weeks or a watchman implanted in the last 45 days    Per office protocol, patient can hold Eliquis   for 1-2 days prior to procedure.   Patient will not need bridging with Lovenox (enoxaparin) around procedure.  **This guidance is not considered finalized until pre-operative APP has relayed final recommendations.**

## 2024-08-11 ENCOUNTER — Telehealth: Payer: Self-pay | Admitting: Family Medicine

## 2024-08-11 NOTE — Telephone Encounter (Signed)
 [facility] faxed refill request for the following medications:  tirzepatide  (ZEPBOUND ) 5 MG/0.5ML Pen    Please advise.

## 2024-08-12 ENCOUNTER — Other Ambulatory Visit: Payer: Self-pay

## 2024-08-12 DIAGNOSIS — G4733 Obstructive sleep apnea (adult) (pediatric): Secondary | ICD-10-CM

## 2024-08-12 NOTE — Telephone Encounter (Signed)
 Converted to rx request

## 2024-08-18 ENCOUNTER — Telehealth: Payer: Self-pay

## 2024-08-18 NOTE — Telephone Encounter (Signed)
 Per pt would like to cancel procedure due to he doesn't want to stop taking his weight lose medication nor his blood thinner. He wants to wait a year and he wants it's on a Tuesday!

## 2024-08-26 ENCOUNTER — Ambulatory Visit (INDEPENDENT_AMBULATORY_CARE_PROVIDER_SITE_OTHER): Admitting: Family Medicine

## 2024-08-26 ENCOUNTER — Encounter: Payer: Self-pay | Admitting: Family Medicine

## 2024-08-26 VITALS — BP 115/68 | HR 55 | Temp 97.3°F | Ht 72.0 in | Wt 300.3 lb

## 2024-08-26 DIAGNOSIS — I480 Paroxysmal atrial fibrillation: Secondary | ICD-10-CM | POA: Insufficient documentation

## 2024-08-26 DIAGNOSIS — Z0000 Encounter for general adult medical examination without abnormal findings: Secondary | ICD-10-CM

## 2024-08-26 DIAGNOSIS — G4733 Obstructive sleep apnea (adult) (pediatric): Secondary | ICD-10-CM

## 2024-08-26 MED ORDER — ZEPBOUND 5 MG/0.5ML ~~LOC~~ SOAJ: 5.0000 mg | SUBCUTANEOUS | 2 refills | Status: AC

## 2024-08-26 NOTE — Assessment & Plan Note (Signed)
 Status post ablation. Currently on apixaban  5 mg twice daily. Follows with cardiology; defer to specialist management.

## 2024-08-26 NOTE — Progress Notes (Signed)
 "  Chief Complaint  Patient presents with   Medicare Wellness    Patient is here today for a welcome to medicare visit.     Subjective:   Caleb Lopez is a 63 y.o. male who presents for a Welcome to Medicare Exam.    He experiences episodes of elevated heart rate, with a recent reading of 137 beats per minute while at work. Relaxation techniques, such as watching television, temporarily reduced his heart rate to 76 beats per minute, but it later increased to 131 beats per minute. He continues to take Eliquis  twice daily. Occasional palpitations are noted, often associated with anxiety when observing high heart rate readings. No heart rates as high as 140 beats per minute have been experienced since his ablation. No chest pain or shortness of breath since the procedure and improvement in symptoms such as seeing stars and needing to catch his breath when bending over.  He has a history of sleep apnea but was unable to tolerate CPAP therapy due to discomfort with the masks. He is considering trying a mouthpiece as an alternative. His wife has noticed a reduction in his snoring since starting Zepbound , which he takes at 5 mg weekly.  He has postponed a colonoscopy for a year due to concerns about interrupting his Eliquis  regimen. He does endorse a history of polyps.  He was constipated for three days but reports improvement in bowel habits since starting Zepbound , which initially caused diarrhea.  He experiences swelling in his leg, for which compression socks were ineffective and caused bruising. He plans to try a different type of sock with straps.  He takes several medications including Zepbound , Eliquis , atorvastatin  at 40 mg, and krill oil at 1000 mg. He also uses a topical cream for a dry itch on his leg, which has been less effective over time.    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) Persons participating in visit and providing  information:: patient Medicare Wellness Visit Mode:: In-person (required for WTM) Interpreter Needed?: No Pre-visit prep was completed: no AWV questionnaire completed by patient prior to visit?: no Living arrangements:: lives with spouse/significant other Patient's Overall Health Status Rating: good Typical amount of pain: some (Legs swelling) Does pain affect daily life?: no Are you currently prescribed opioids?: no  Dietary Habits and Nutritional Risks How many meals a day?: 3 Eats fruit and vegetables daily?: yes Most meals are obtained by: preparing own meals In the last 2 weeks, have you had any of the following?: none Diabetic:: no  Functional Status Activities of Daily Living (to include ambulation/medication): Independent Ambulation: Independent Medication Administration: Independent Home Management (perform basic housework or laundry): Independent Manage your own finances?: yes Primary transportation is: driving Concerns about vision?: no *vision screening is required for WTM* Concerns about hearing?: no  Fall Screening Falls in the past year?: 0 Number of falls in past year: 0 Was there an injury with Fall?: 0 Fall Risk Category Calculator: 0 Patient Fall Risk Level: Low Fall Risk  Fall Risk Patient at Risk for Falls Due to: No Fall Risks  Home and Transportation Safety: All rugs have non-skid backing?: N/A, no rugs All stairs or steps have railings?: yes Grab bars in the bathtub or shower?: (!) no Have non-skid surface in bathtub or shower?: yes Good home lighting?: yes Regular seat belt use?: yes Hospital stays in the last year:: (!) yes How many hospital stays:: 1 Reason: Irregular Hearbeat June 2025  Cognitive  Assessment Difficulty concentrating, remembering, or making decisions? : no Will 6CIT or Mini Cog be Completed: yes What year is it?: 0 points What month is it?: 0 points Give patient an address phrase to remember (5 components): Caleb Lopez  [address] About what time is it?: 0 points Count backwards from 20 to 1: 0 points Say the months of the year in reverse: 2 points Repeat the address phrase from earlier: 0 points 6 CIT Score: 2 points  Advance Directives (For Healthcare) Does Patient Have a Medical Advance Directive?: No Would patient like information on creating a medical advance directive?: Yes (ED - Information included in AVS)  Reviewed/Updated  Reviewed/Updated: Reviewed All (Medical, Surgical, Family, Medications, Allergies, Care Teams, Patient Goals)     Allergies (verified) Patient has no known allergies.   Current Medications (verified) Outpatient Encounter Medications as of 08/26/2024  Medication Sig   apixaban  (ELIQUIS ) 5 MG TABS tablet Take 1 tablet (5 mg total) by mouth 2 (two) times daily.   atorvastatin  (LIPITOR) 40 MG tablet Take 1 tablet (40 mg total) by mouth daily.   Krill Oil 500 MG CAPS Take 1,000 mg by mouth daily at 12 noon. With Atorvastatin    [DISCONTINUED] tirzepatide  (ZEPBOUND ) 5 MG/0.5ML Pen Inject 5 mg into the skin once a week.   tirzepatide  (ZEPBOUND ) 5 MG/0.5ML Pen Inject 5 mg into the skin once a week.   [DISCONTINUED] terbinafine  (LAMISIL ) 1 % cream Apply 1 Application topically 2 (two) times daily. (Patient not taking: Reported on 07/16/2024)   No facility-administered encounter medications on file as of 08/26/2024.    History: Past Medical History:  Diagnosis Date   Borderline diabetes    CAD (coronary artery disease)    a. 07/2008 NSTEMI/Cath ([facility]): LM nl, LAD 60, LCX nl, RCA nl, EF 55%->Med rx. Presentation felt to be secondary to pericarditis.   Morbid obesity (HCC)    Pericarditis 07/2008   RBBB    Past Surgical History:  Procedure Laterality Date   A-FLUTTER ABLATION N/A 03/24/2024   Procedure: A-FLUTTER ABLATION;  Surgeon: Cindie Ole DASEN, MD;  Location: [facility] INVASIVE CV LAB;  Service: Cardiovascular;  Laterality: N/A;   CARDIOVERSION N/A 02/15/2024    Procedure: CARDIOVERSION;  Surgeon: Cherrie Toribio SAUNDERS, MD;  Location: [facility] ORS;  Service: Cardiovascular;  Laterality: N/A;   COLONOSCOPY WITH PROPOFOL  N/A 06/03/2019   Procedure: COLONOSCOPY WITH PROPOFOL ;  Surgeon: Jinny Carmine, MD;  Location: [facility] ENDOSCOPY;  Service: Endoscopy;  Laterality: N/A;   TEE WITHOUT CARDIOVERSION N/A 02/15/2024   Procedure: ECHOCARDIOGRAM, TRANSESOPHAGEAL;  Surgeon: Cherrie Toribio SAUNDERS, MD;  Location: [facility] ORS;  Service: Cardiovascular;  Laterality: N/A;   THORACENTESIS  1980'S   x's 2 probably infection   TONSILLECTOMY AND ADENOIDECTOMY  1960'S   Family History  Problem Relation Age of Onset   Diverticulitis Mother    Arthritis Mother    Glaucoma Mother    Diabetes Father    Social History   Occupational History   Not on file  Tobacco Use   Smoking status: Former    Current packs/day: 0.00    Types: Cigarettes    Quit date: 09/11/1988    Years since quitting: 36.0   Smokeless tobacco: Never  Vaping Use   Vaping status: Never Used  Substance and Sexual Activity   Alcohol use: Yes    Comment: occasional   Drug use: Not Currently   Sexual activity: Yes   Tobacco Counseling Counseling given: Not Answered  SDOH Screenings   Food Insecurity:  Patient Declined (08/22/2024)  Housing: Unknown (08/22/2024)  Transportation Needs: No Transportation Needs (08/22/2024)  Utilities: Not At Risk (02/14/2024)  Alcohol Screen: Low Risk (08/26/2024)  Depression (PHQ2-9): Low Risk (08/26/2024)  Financial Resource Strain: Patient Declined (08/22/2024)  Physical Activity: Insufficiently Active (08/22/2024)  Social Connections: Moderately Isolated (08/22/2024)  Stress: No Stress Concern Present (08/22/2024)  Tobacco Use: Medium Risk (08/26/2024)  Health Literacy: Adequate Health Literacy (06/03/2024)   See flowsheets for full screening details  Depression Screen PHQ 2 & 9 Depression Scale- Over the past 2 weeks, how often have you been bothered by any of the  following problems? Little interest or pleasure in doing things: 0 Feeling down, depressed, or hopeless (PHQ Adolescent also includes...irritable): 0 PHQ-2 Total Score: 0 Trouble falling or staying asleep, or sleeping too much: 0 Feeling tired or having little energy: 0 Poor appetite or overeating (PHQ Adolescent also includes...weight loss): 3 Feeling bad about yourself - or that you are a failure or have let yourself or your family down: 0 Trouble concentrating on things, such as reading the newspaper or watching television (PHQ Adolescent also includes...like school work): 0 Moving or speaking so slowly that other people could have noticed. Or the opposite - being so fidgety or restless that you have been moving around a lot more than usual: 0 Thoughts that you would be better off dead, or of hurting yourself in some way: 0 PHQ-9 Total Score: 3 If you checked off any problems, how difficult have these problems made it for you to do your work, take care of things at home, or get along with other people?: Somewhat difficult      Goals Addressed   None        Objective:    Today's Vitals   08/26/24 1337  BP: 115/68  Pulse: (!) 55  Temp: (!) 97.3 F (36.3 C)  TempSrc: Oral  SpO2: 97%  Weight: (!) 300 lb 4.8 oz (136.2 kg)  Height: 6' (1.829 m)   Body mass index is 40.73 kg/m.   Physical Exam Vitals and nursing note reviewed.  Constitutional:      General: He is awake.     Appearance: Normal appearance.  HENT:     Head: Normocephalic and atraumatic.     Right Ear: Tympanic membrane, ear canal and external ear normal.     Left Ear: Tympanic membrane, ear canal and external ear normal.     Nose: Nose normal.     Mouth/Throat:     Mouth: Mucous membranes are moist.     Pharynx: Oropharynx is clear. No oropharyngeal exudate or posterior oropharyngeal erythema.  Eyes:     General: No scleral icterus.    Extraocular Movements: Extraocular movements intact.      Conjunctiva/sclera: Conjunctivae normal.     Pupils: Pupils are equal, round, and reactive to light.  Neck:     Thyroid : No thyromegaly or thyroid  tenderness.  Cardiovascular:     Rate and Rhythm: Normal rate and regular rhythm.     Pulses: Normal pulses.     Heart sounds: Normal heart sounds.  Pulmonary:     Effort: Pulmonary effort is normal. No tachypnea, bradypnea or respiratory distress.     Breath sounds: Normal breath sounds. No stridor. No wheezing, rhonchi or rales.  Abdominal:     General: Bowel sounds are normal. There is no distension.     Palpations: Abdomen is soft. There is no mass.     Tenderness: There is no abdominal tenderness.  There is no guarding.     Hernia: No hernia is present.  Genitourinary:    Rectum: No external hemorrhoid (skin tags from previous hemorrhoids) or internal hemorrhoid.  Musculoskeletal:     Cervical back: Normal range of motion and neck supple.     Right lower leg: Edema (trace) present.     Left lower leg: Edema (+1) present.  Lymphadenopathy:     Cervical: No cervical adenopathy.  Skin:    General: Skin is warm and dry.  Neurological:     Mental Status: He is alert and oriented to person, place, and time. Mental status is at baseline.  Psychiatric:        Mood and Affect: Mood normal.        Behavior: Behavior normal.       Hearing/Vision screen No results found. Immunizations and Health Maintenance Health Maintenance  Topic Date Due   Colonoscopy  07/13/2025 (Originally 06/02/2024)   Medicare Annual Wellness (AWV)  08/26/2025   DTaP/Tdap/Td (3 - Td or Tdap) 01/01/2034   Pneumococcal Vaccine: 50+ Years  Completed   Influenza Vaccine  Completed   Hepatitis C Screening  Completed   HIV Screening  Completed   Zoster Vaccines- Shingrix   Completed   Hepatitis B Vaccines 19-59 Average Risk  Aged Out   Meningococcal B Vaccine  Aged Out   COVID-19 Vaccine  Discontinued    EKG: normal EKG, normal sinus rhythm, unchanged from  previous tracings, PAC's noted     Assessment/Plan:  This is a routine wellness examination for Cailen.   Paroxysmal atrial fibrillation Intermittent elevated heart rate, controlled post-ablation. Continues anticoagulation with Eliquis . - Continue Eliquis  twice daily. - Continue follow-up with cardiology.  Obstructive sleep apnea Difficulty with CPAP due to mask discomfort. Considering mouthpiece. Improvement in snoring with Zepbound . Discussed weight loss benefits for sleep apnea. - Consider purchasing a mouthpiece for sleep apnea management per patient preference. - Continue Zepbound  at current dose (5mg  weekly).   Patient Care Team: Miyako Oelke, Lauraine SAILOR, DO as PCP - General (Family Medicine) Darron Deatrice LABOR, MD as PCP - Cardiology (Cardiology) Cindie Ole DASEN, MD as PCP - Electrophysiology (Cardiology) Riddle, Suzann, NP as Nurse Practitioner (Clinical Cardiac Electrophysiology)  I have personally reviewed and noted the following in the patients chart:   Medical and social history Use of alcohol, tobacco or illicit drugs  Current medications and supplements including opioid prescriptions. Functional ability and status Nutritional status Physical activity Advanced directives List of other physicians Hospitalizations, surgeries, and ER visits in previous 12 months Vitals Screenings to include cognitive, depression, and falls Referrals and appointments Patient is not currently on any opioid medications.  Orders Placed This Encounter  Procedures   EKG 12-Lead   In addition, I have reviewed and discussed with patient certain preventive protocols, quality metrics, and best practice recommendations. A written personalized care plan for preventive services as well as general preventive health recommendations were provided to patient.   Willliam Pettet N Zailah Zagami, DO   09/09/2024   Return in about 3 months (around 11/24/2024) for Chronic f/u and in 6 months for TOC with next provider.   "

## 2024-09-08 ENCOUNTER — Ambulatory Visit: Attending: Cardiovascular Disease | Admitting: Physician Assistant

## 2024-09-08 DIAGNOSIS — Z01810 Encounter for preprocedural cardiovascular examination: Secondary | ICD-10-CM

## 2024-09-08 NOTE — Progress Notes (Signed)
"  ° °  Virtual Visit via Telephone Note   His procedure was canceled (colonoscopy). We did discuss his Eliquis  vs. Xarelto . His insurance is changing and wanted to discuss options.   He did have an episode where his watch read it was in the 130s but his heart did return down to the 70s. Although, this is the only episode he can recall.   Orren LOISE Fabry, PA-C  09/08/2024, 2:29 PM  "

## 2024-09-15 NOTE — Telephone Encounter (Signed)
"    08/18/24 11:43 AM Note Per pt would like to cancel procedure due to he doesn't want to stop taking his weight lose medication nor his blood thinner. He wants to wait a year and he wants it's on a Tuesday!    Per message received from [place].  Thanks,  [place], CMA "

## 2024-09-23 ENCOUNTER — Ambulatory Visit: Admit: 2024-09-23 | Admitting: Gastroenterology

## 2024-09-23 SURGERY — COLONOSCOPY
Anesthesia: General

## 2024-11-25 ENCOUNTER — Ambulatory Visit: Admitting: Family Medicine

## 2025-02-03 ENCOUNTER — Ambulatory Visit: Admitting: Dermatology
